# Patient Record
Sex: Female | Born: 1955 | Race: White | Hispanic: No | Marital: Married | Smoking: Never smoker
Health system: Southern US, Community
[De-identification: ages and names within clinical notes are randomized; demographics above are authoritative.]

## PROBLEM LIST (undated history)

## (undated) DIAGNOSIS — L0591 Pilonidal cyst without abscess: Secondary | ICD-10-CM

## (undated) DIAGNOSIS — F32A Depression, unspecified: Secondary | ICD-10-CM

## (undated) DIAGNOSIS — F329 Major depressive disorder, single episode, unspecified: Secondary | ICD-10-CM

## (undated) DIAGNOSIS — F419 Anxiety disorder, unspecified: Secondary | ICD-10-CM

## (undated) HISTORY — PX: WISDOM TOOTH EXTRACTION: SHX21

## (undated) HISTORY — DX: Depression, unspecified: F32.A

## (undated) HISTORY — DX: Major depressive disorder, single episode, unspecified: F32.9

## (undated) HISTORY — DX: Pilonidal cyst without abscess: L05.91

## (undated) HISTORY — DX: Anxiety disorder, unspecified: F41.9

---

## 2000-01-16 ENCOUNTER — Other Ambulatory Visit: Admission: RE | Admit: 2000-01-16 | Discharge: 2000-01-16 | Payer: Self-pay | Admitting: Obstetrics and Gynecology

## 2001-04-12 ENCOUNTER — Other Ambulatory Visit: Admission: RE | Admit: 2001-04-12 | Discharge: 2001-04-12 | Payer: Self-pay | Admitting: Obstetrics and Gynecology

## 2002-12-15 ENCOUNTER — Other Ambulatory Visit: Admission: RE | Admit: 2002-12-15 | Discharge: 2002-12-15 | Payer: Self-pay | Admitting: Obstetrics and Gynecology

## 2004-01-11 ENCOUNTER — Other Ambulatory Visit: Admission: RE | Admit: 2004-01-11 | Discharge: 2004-01-11 | Payer: Self-pay | Admitting: Obstetrics and Gynecology

## 2005-01-20 ENCOUNTER — Other Ambulatory Visit: Admission: RE | Admit: 2005-01-20 | Discharge: 2005-01-20 | Payer: Self-pay | Admitting: Obstetrics and Gynecology

## 2006-02-24 ENCOUNTER — Other Ambulatory Visit: Admission: RE | Admit: 2006-02-24 | Discharge: 2006-02-24 | Payer: Self-pay | Admitting: Obstetrics and Gynecology

## 2007-04-01 ENCOUNTER — Other Ambulatory Visit: Admission: RE | Admit: 2007-04-01 | Discharge: 2007-04-01 | Payer: Self-pay | Admitting: Obstetrics and Gynecology

## 2008-04-09 ENCOUNTER — Other Ambulatory Visit: Admission: RE | Admit: 2008-04-09 | Discharge: 2008-04-09 | Payer: Self-pay | Admitting: Obstetrics and Gynecology

## 2009-02-01 ENCOUNTER — Other Ambulatory Visit: Admission: RE | Admit: 2009-02-01 | Discharge: 2009-02-01 | Payer: Self-pay | Admitting: Obstetrics and Gynecology

## 2012-04-22 ENCOUNTER — Other Ambulatory Visit: Payer: Self-pay | Admitting: Family Medicine

## 2012-04-22 DIAGNOSIS — R599 Enlarged lymph nodes, unspecified: Secondary | ICD-10-CM

## 2012-05-04 ENCOUNTER — Other Ambulatory Visit: Payer: Self-pay | Admitting: Family Medicine

## 2012-05-04 ENCOUNTER — Ambulatory Visit
Admission: RE | Admit: 2012-05-04 | Discharge: 2012-05-04 | Disposition: A | Discharge status: Home / Self Care | Payer: BC Managed Care – PPO | Source: Ambulatory Visit | Attending: Family Medicine | Admitting: Family Medicine

## 2012-05-04 DIAGNOSIS — R599 Enlarged lymph nodes, unspecified: Secondary | ICD-10-CM

## 2012-05-04 DIAGNOSIS — R102 Pelvic and perineal pain: Secondary | ICD-10-CM

## 2013-01-24 ENCOUNTER — Other Ambulatory Visit: Payer: Self-pay | Admitting: Obstetrics and Gynecology

## 2013-01-24 ENCOUNTER — Telehealth: Payer: Self-pay | Admitting: *Deleted

## 2013-01-24 NOTE — Telephone Encounter (Signed)
Please review prior authorization form to be faxed for [facility], for [place]. Fannie Knee , will fax when completed. Chart in your cabinet.

## 2013-02-09 ENCOUNTER — Other Ambulatory Visit: Payer: Self-pay | Admitting: *Deleted

## 2013-02-09 NOTE — Telephone Encounter (Signed)
See chart in cabinet . PRIOR APPROVAL FOR MEDICATION FROM [facility] WAS NOT APPROVED DUE TO QUANITY  LIMITS EXCEPTIONS. EXCEEDS THE QUANITY THAT COVENTRY DERERMINED TO BE MEDICALLY APPROPIATE. GIANVI IS LIMITED TO 1.5 TABLETS PER DAY.

## 2013-02-14 ENCOUNTER — Telehealth: Payer: Self-pay | Admitting: *Deleted

## 2013-02-14 NOTE — Telephone Encounter (Signed)
FYI Dr. Hyacinth Meeker. Rx for Helen Hashimoto was filled on 01/24/2013 at [facility] for 28 day supply only. With refills . Disregard PA. sue

## 2013-02-23 IMAGING — US US TRANSVAGINAL NON-OB
1 series · 13 of 25 positions shown · non-contrast
Comparison: None.

CLINICAL DATA: Pelvic pain.  Post menopausal female.  On hormone
replacement therapy.



[Series 1: us transvaginal non-ob · 0.22mm/px · 13 of 65 slices shown]
[im 1/65]
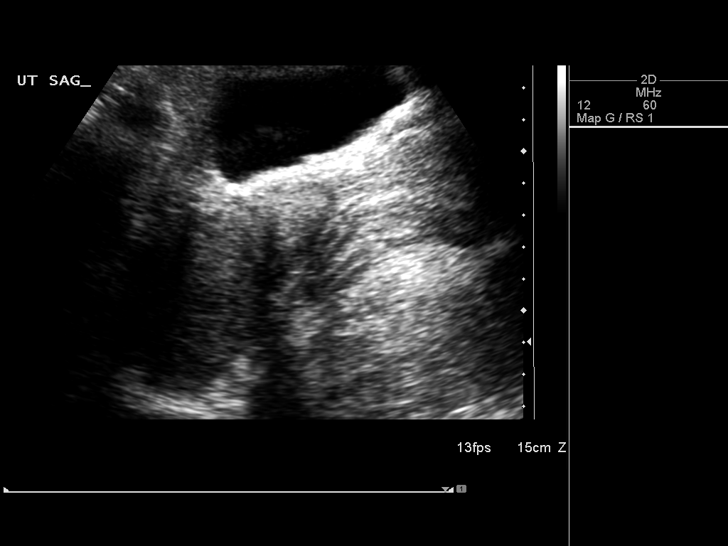
[im 6/65]
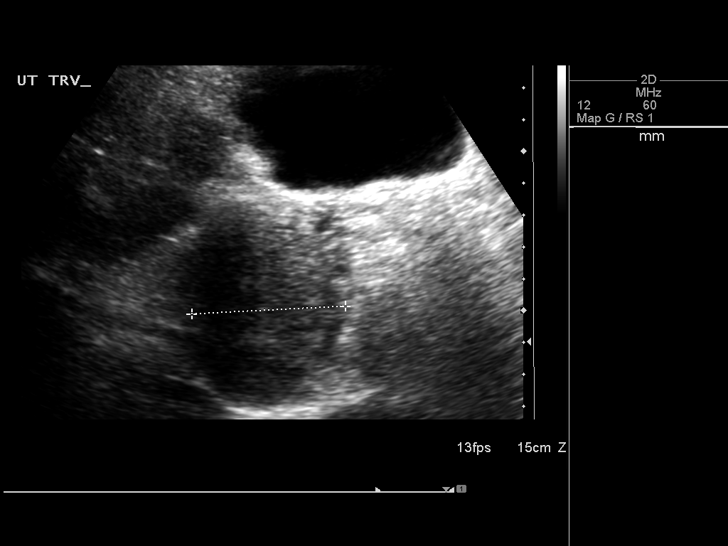
[im 11/65]
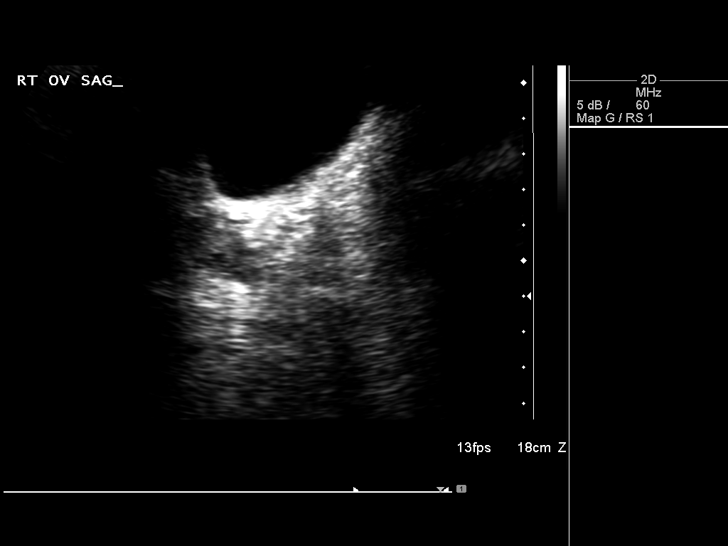
[im 17/65]
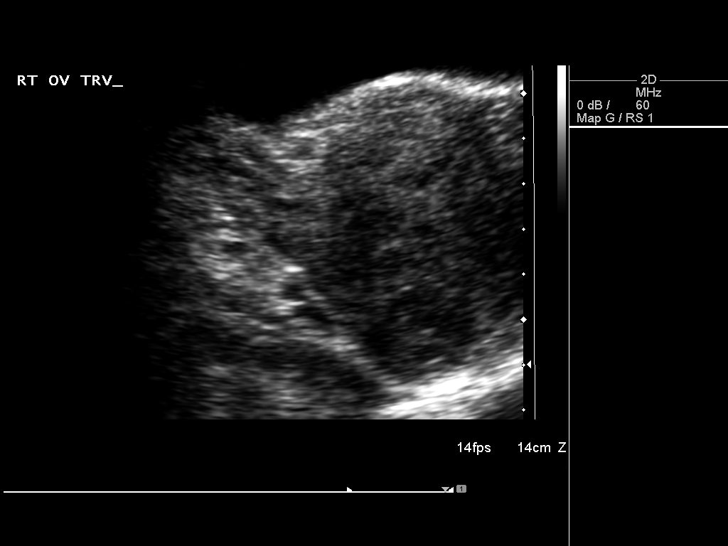
[im 22/65]
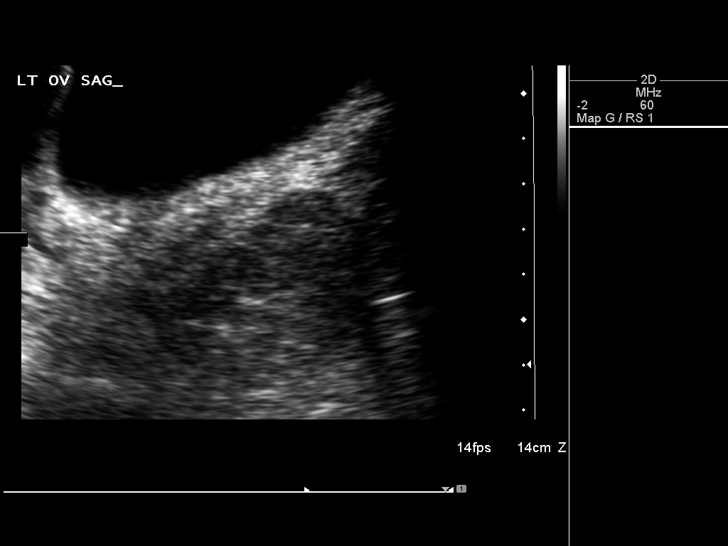
[im 27/65]
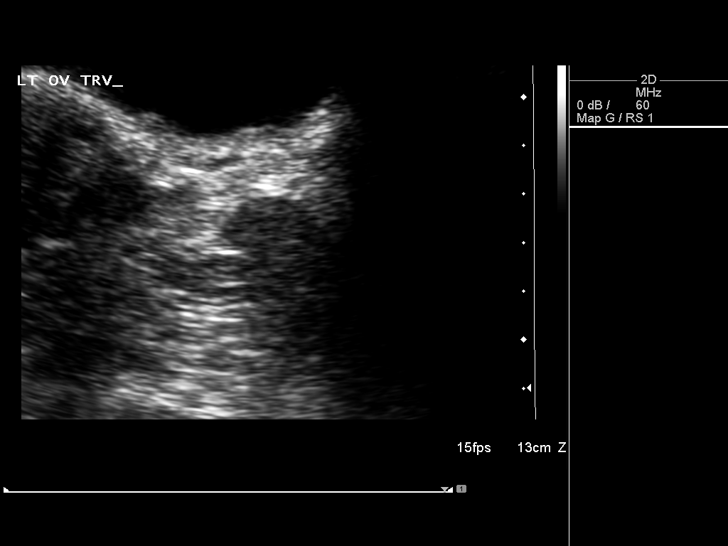
[im 33/65]
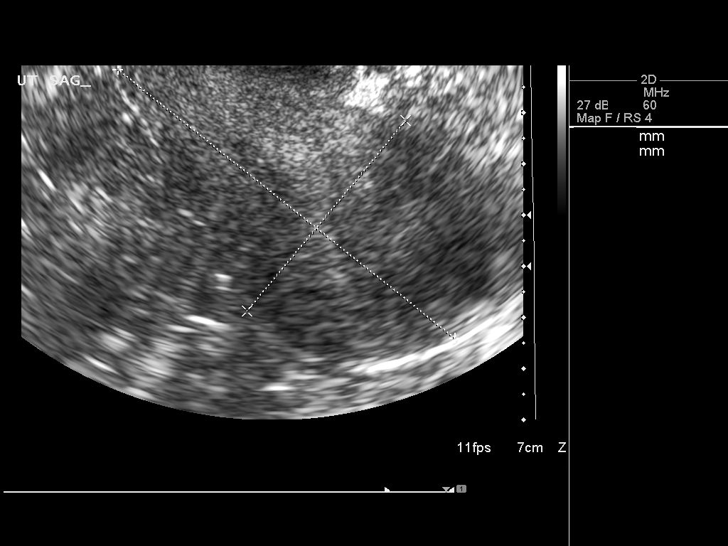
[im 38/65]
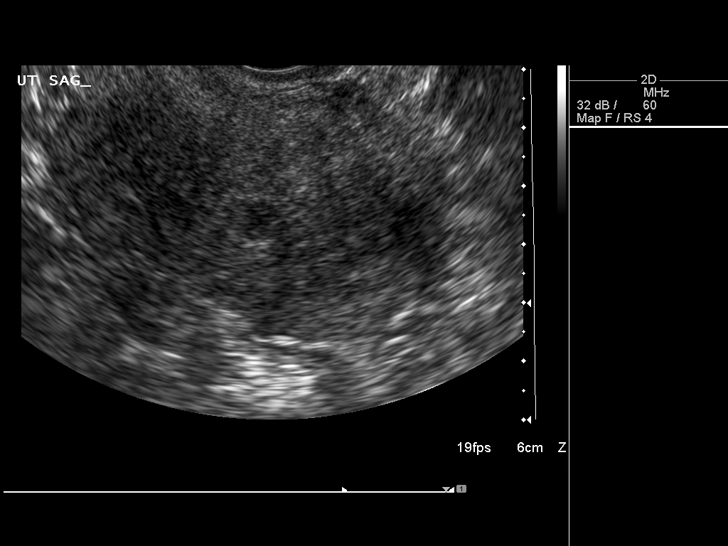
[im 43/65]
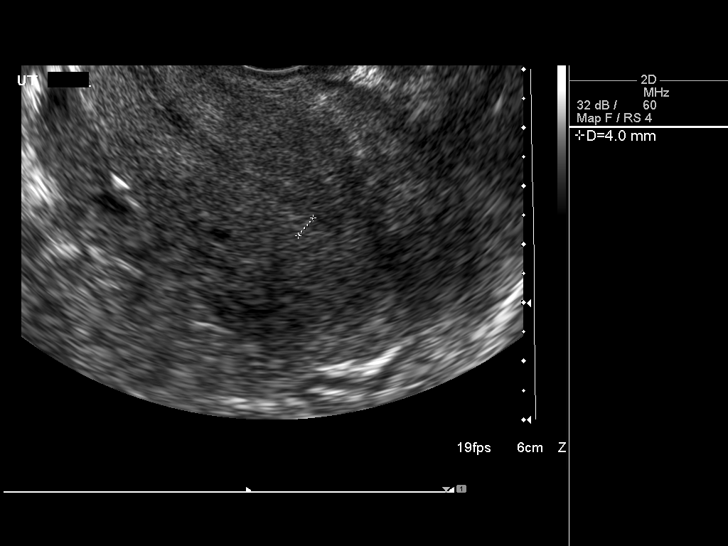
[im 49/65]
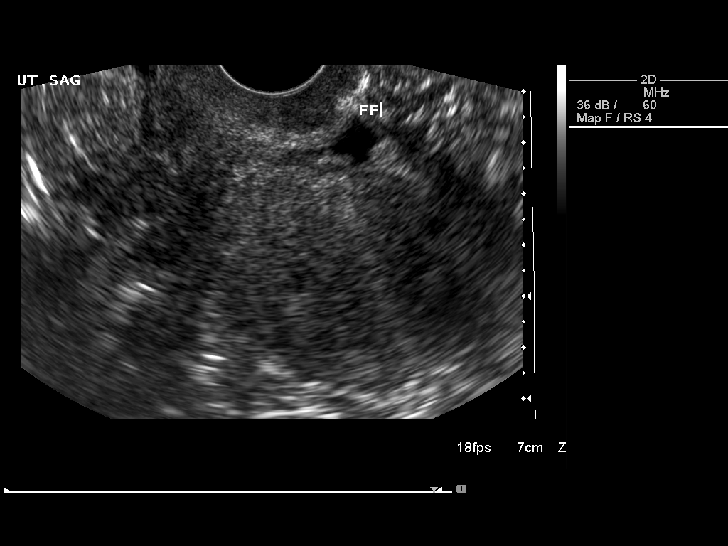
[im 54/65]
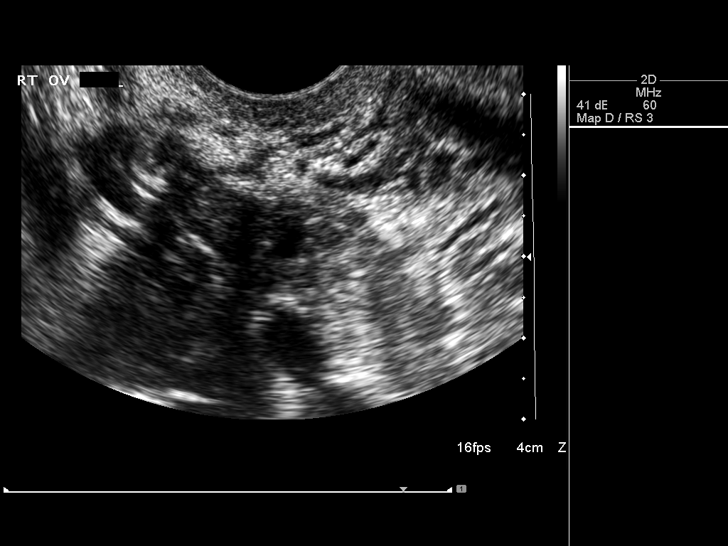
[im 59/65]
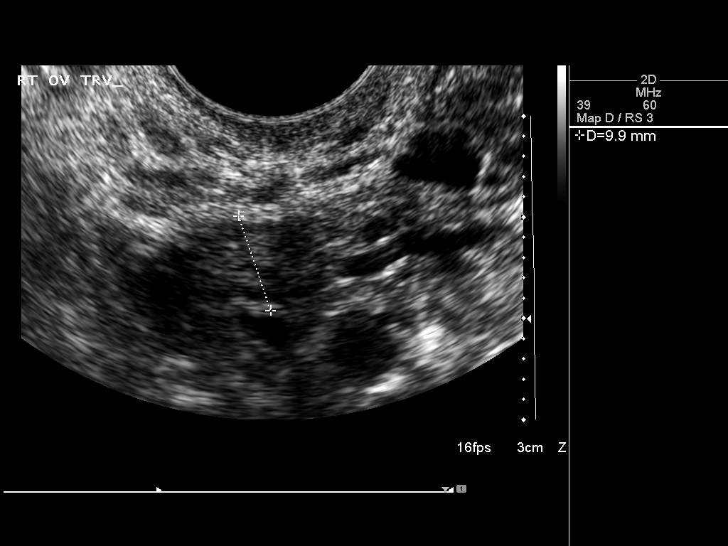
[im 65/65]
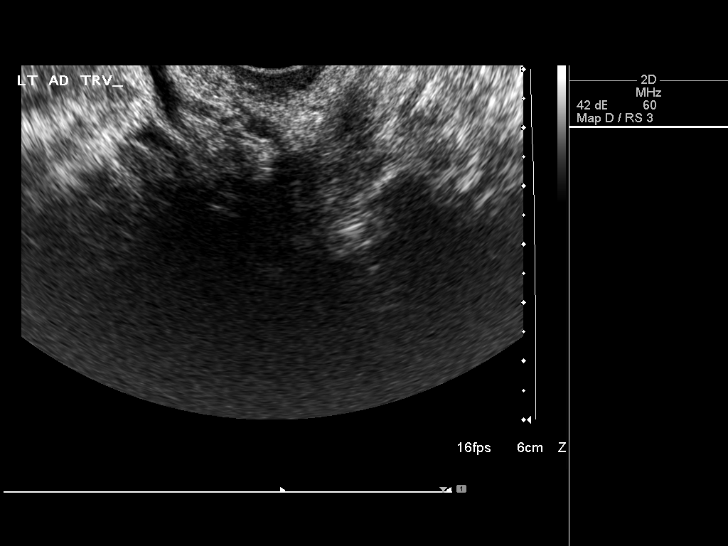

[13 of 25 positions shown; findings below may reference images not displayed]

FINDINGS: Uterus:  8.4 x 4.8 x 5.6 cm.  Retroverted. An intramural fibroid is
seen in the right posterior corpus measuring 2.2 cm in maximum
diameter.  A 1 cm fibroid is seen in the adjacent to the
endometrial stripe in the left fundus.

Endometrium: Double layer thickness measures 10 mm transvaginally.
No focal lesion visualized.

Right ovary: 2.3 x 1.2 x 1.0 cm.  Normal appearance.

Left ovary: 3.1 x 2.1 x 2.2 cm.  Normal appearance.

Other Findings:  No free fluid
IMPRESSION: 1.  Retroverted uterus with two small uterine fibroids, largest
measuring 2.2 cm.
2.  Normal appearance of both ovaries.  No adnexal mass or free
fluid identified.

## 2013-06-21 ENCOUNTER — Ambulatory Visit: Payer: Self-pay | Admitting: Obstetrics and Gynecology

## 2013-07-18 ENCOUNTER — Encounter: Payer: Self-pay | Admitting: Certified Nurse Midwife

## 2013-07-18 ENCOUNTER — Ambulatory Visit (INDEPENDENT_AMBULATORY_CARE_PROVIDER_SITE_OTHER): Payer: No Typology Code available for payment source | Admitting: Certified Nurse Midwife

## 2013-07-18 VITALS — BP 104/64 | HR 64 | Resp 16 | Ht 65.5 in | Wt 130.0 lb

## 2013-07-18 DIAGNOSIS — Z0000 Encounter for general adult medical examination without abnormal findings: Secondary | ICD-10-CM

## 2013-07-18 DIAGNOSIS — Z01419 Encounter for gynecological examination (general) (routine) without abnormal findings: Secondary | ICD-10-CM

## 2013-07-18 DIAGNOSIS — N951 Menopausal and female climacteric states: Secondary | ICD-10-CM

## 2013-07-18 LAB — POCT URINALYSIS DIPSTICK
Blood, UA: NEGATIVE
Protein, UA: NEGATIVE
Urobilinogen, UA: NEGATIVE
pH, UA: 5

## 2013-07-18 NOTE — Patient Instructions (Signed)

## 2013-07-18 NOTE — Progress Notes (Signed)
57 y.o. G54P2002 Divorced Caucasian Fe here for annual exam. Menopausal?. Stopped OCP(YAZ) one month ago, Continued use due to PMS and need for cycle control in past. No bleeding episode since 2008. Current medications for anxiety working well.. Denies hot flashes, vaginal dryness or anxiety issues. May be interested in HRT if becomes symptomatic. Sees PCP for labs and aex and medication management.  Patient's last menstrual period was 10/26/2006.          Sexually active: yes  The current method of family planning is condoms all the time.    Exercising: no  exercise Smoker:  no  Health Maintenance: Pap:  06-10-10 neg MMG:  09/21/12 normal Colonoscopy: 2010 neg. 5 years BMD:   none TDaP:  12/08 Labs:Poct urine-neg, Hgb-13.3 Self breast exam:not done   reports that she has never smoked. She does not have any smokeless tobacco history on file. She reports that she drinks about 2.5 ounces of alcohol per week. She reports that she does not use illicit drugs.  Past Medical History  Diagnosis Date  . Depression   . Pilonidal cyst   . Anxiety     Past Surgical History  Procedure Laterality Date  . Wisdom tooth extraction      Current Outpatient Prescriptions  Medication Sig Dispense Refill  . ALPRAZolam (XANAX) 0.5 MG tablet daily.      Marland Kitchen atorvastatin (LIPITOR) 10 MG tablet daily.      Marland Kitchen buPROPion (WELLBUTRIN XL) 300 MG 24 hr tablet daily.      Marland Kitchen doxycycline (VIBRA-TABS) 100 MG tablet as needed.      . zolpidem (AMBIEN CR) 12.5 MG CR tablet daily.       No current facility-administered medications for this visit.    Family History  Problem Relation Age of Onset  . Scleroderma Mother   . Hypertension Father   . Scleroderma Father   . Hypertension Brother   . Hyperlipidemia Brother     ROS:  Pertinent items are noted in HPI.  Otherwise, a comprehensive ROS was negative.  Exam:   BP 104/64  Pulse 64  Resp 16  Ht 5' 5.5" (1.664 m)  Wt 130 lb (58.968 kg)  BMI 21.3 kg/m2   LMP 10/26/2006 Height: 5' 5.5" (166.4 cm)  Ht Readings from Last 3 Encounters:  07/18/13 5' 5.5" (1.664 m)    General appearance: alert, cooperative and appears stated age Head: Normocephalic, without obvious abnormality, atraumatic Neck: no adenopathy, supple, symmetrical, trachea midline and thyroid normal to inspection and palpation Lungs: clear to auscultation bilaterally Breasts: normal appearance, no masses or tenderness, No nipple retraction or dimpling, No nipple discharge or bleeding, No axillary or supraclavicular adenopathy Heart: regular rate and rhythm Abdomen: soft, non-tender; no masses,  no organomegaly Extremities: extremities normal, atraumatic, no cyanosis or edema Skin: Skin color, texture, turgor normal. No rashes or lesions Lymph nodes: Cervical, supraclavicular, and axillary nodes normal. No abnormal inguinal nodes palpated Neurologic: Grossly normal   Pelvic: External genitalia:  no lesions              Urethra:  normal appearing urethra with no masses, tenderness or lesions              Bartholin's and Skene's: normal                 Vagina: normal appearing vagina with normal color and discharge, no lesions              Cervix: normal, non tender  Pap taken: yes Bimanual Exam:  Uterus:  normal size, contour, position, consistency, mobility, non-tender and anteverted              Adnexa: normal adnexa and no mass, fullness, tenderness               Rectovaginal: Confirms               Anus:  normal sphincter tone, no lesions  A:  Well Woman with normal exam  Menopausal ?, recent OCP  discontinuance one month ago  Anxiety on stable medication with MD  P:   Reviewed health and wellness pertinent to exam  Discussed risks and benefits of HRT. May consider if becomes symptomatic. Lab: [facility]  Pap smear as per guidelines   Mammogram yearly pap smear taken today with HPVHR  counseled on breast self exam, mammography screening, use and side effects of  HRT, adequate intake of calcium and vitamin D, diet and exercise, Kegel's exercises  return annually or prn  An After Visit Summary was printed and given to the patient.

## 2013-07-19 ENCOUNTER — Telehealth: Payer: Self-pay | Admitting: Obstetrics & Gynecology

## 2013-07-19 NOTE — Telephone Encounter (Signed)
Pt calling for Kennon Rounds to leave pharmacy information  [facility] [place] [phone]

## 2013-07-23 NOTE — Progress Notes (Signed)
Note reviewed, agree with plan.  Douglass Rivers, MD [facility] elevated 34

## 2013-07-24 NOTE — Telephone Encounter (Signed)
Please disregard the last entry it was made in error.

## 2013-07-25 ENCOUNTER — Other Ambulatory Visit: Payer: Self-pay | Admitting: Certified Nurse Midwife

## 2013-07-25 DIAGNOSIS — N951 Menopausal and female climacteric states: Secondary | ICD-10-CM

## 2013-07-25 NOTE — Progress Notes (Signed)
Order in.

## 2013-07-28 ENCOUNTER — Other Ambulatory Visit (INDEPENDENT_AMBULATORY_CARE_PROVIDER_SITE_OTHER): Payer: No Typology Code available for payment source

## 2013-07-28 DIAGNOSIS — N951 Menopausal and female climacteric states: Secondary | ICD-10-CM

## 2013-07-31 LAB — ANTI MULLERIAN HORMONE: AMH AssessR: <0.03 ng/mL

## 2013-09-30 ENCOUNTER — Other Ambulatory Visit (HOSPITAL_COMMUNITY): Payer: Self-pay | Admitting: Psychiatry

## 2013-10-17 ENCOUNTER — Telehealth: Payer: Self-pay | Admitting: Certified Nurse Midwife

## 2013-10-17 NOTE — Telephone Encounter (Signed)
Patient c/o vaginal pain, labial irritation and vaginal discharge.  States "this doesn't feel like a UTI". Does not feel this is yeast r/t as well. Recent sexual relations with new partner. Patient concerned may be STD related.  Denies fevers or abdominal pain.  Office visit scheduled.  Patient requests appointment for 12/24. Declines OV today.  Routing to provider for final review. Patient agreeable to disposition. Will close encounter

## 2013-10-17 NOTE — Telephone Encounter (Signed)
Discharge and burning.

## 2013-10-18 ENCOUNTER — Encounter: Payer: Self-pay | Admitting: Obstetrics & Gynecology

## 2013-10-18 ENCOUNTER — Ambulatory Visit (INDEPENDENT_AMBULATORY_CARE_PROVIDER_SITE_OTHER): Payer: No Typology Code available for payment source | Admitting: Obstetrics & Gynecology

## 2013-10-18 VITALS — BP 110/56 | HR 60 | Resp 16 | Ht 63.5 in | Wt 120.0 lb

## 2013-10-18 DIAGNOSIS — Z202 Contact with and (suspected) exposure to infections with a predominantly sexual mode of transmission: Secondary | ICD-10-CM

## 2013-10-18 DIAGNOSIS — N898 Other specified noninflammatory disorders of vagina: Secondary | ICD-10-CM

## 2013-10-18 MED ORDER — ESTROGENS, CONJUGATED 0.625 MG/GM VA CREA: 1.0000 | TOPICAL_CREAM | Freq: Every day | VAGINAL | Status: DC

## 2013-10-18 NOTE — Progress Notes (Signed)
Subjective:     Patient ID: Desiree Gibson, female   DOB: 05-Nov-1955, 57 y.o.   MRN: [id number]  HPI 57 yo G2P2 here with 3 day hx of pinkish/whitish discharge.  Having some external irritation.  Color has changed to a little big yellow.  No recent antibiotic use.  Has been wearing some leggings.  Has a new sex partner for four months.  Requests STD testing just to be sure.   Review of Systems  All other systems reviewed and are negative.       Objective:   Physical Exam  Constitutional: She is oriented to person, place, and time. She appears well-developed and well-nourished.  Genitourinary: Uterus normal. There is no rash or tenderness on the right labia. There is no rash or tenderness on the left labia. Uterus is not tender. Cervix exhibits no motion tenderness, no discharge and no friability. Vaginal discharge found.  Lymphadenopathy:       Right: No inguinal adenopathy present.       Left: No inguinal adenopathy present.  Neurological: She is alert and oriented to person, place, and time.  Skin: Skin is warm and dry.  Psychiatric: She has a normal mood and affect.   Wet smear:  Ph: 5.0, saline: no trich, no clue cells, atrohpic cells, RBCs.  KOH:  Neg whiff, neg yeast.    Assessment:     Vaginal dryness Wet smear with RBCs present     Plan:     Premarin vaginal cream 1/2 - 1 gram vaginally every other night for 2 weeks then decrease to 1/2 gram vaginally twice weekly.  Rx to pharmacy. Gc/Chl pending.

## 2013-10-21 LAB — IPS N GONORRHOEA AND CHLAMYDIA BY PCR

## 2013-12-01 ENCOUNTER — Ambulatory Visit (INDEPENDENT_AMBULATORY_CARE_PROVIDER_SITE_OTHER): Payer: No Typology Code available for payment source | Admitting: Obstetrics & Gynecology

## 2013-12-01 ENCOUNTER — Encounter: Payer: Self-pay | Admitting: Obstetrics & Gynecology

## 2013-12-01 VITALS — BP 121/65 | HR 63 | Resp 16 | Ht 63.5 in | Wt 117.0 lb

## 2013-12-01 DIAGNOSIS — N898 Other specified noninflammatory disorders of vagina: Secondary | ICD-10-CM

## 2013-12-01 DIAGNOSIS — N368 Other specified disorders of urethra: Secondary | ICD-10-CM

## 2013-12-01 DIAGNOSIS — IMO0002 Reserved for concepts with insufficient information to code with codable children: Secondary | ICD-10-CM

## 2013-12-01 DIAGNOSIS — N951 Menopausal and female climacteric states: Secondary | ICD-10-CM

## 2013-12-01 MED ORDER — ESTROGENS, CONJUGATED 0.625 MG/GM VA CREA: 1.0000 | TOPICAL_CREAM | Freq: Every day | VAGINAL | Status: DC

## 2013-12-01 MED ORDER — ESTROGENS, CONJUGATED 0.625 MG/GM VA CREA: TOPICAL_CREAM | VAGINAL | Status: DC

## 2013-12-01 NOTE — Progress Notes (Signed)
Subjective:     Patient ID: Desiree Gibson, female   DOB: 02/09/1956, 58 y.o.   MRN: [id number]  HPI 58 yo G2P2 here for follow up after starting premarin cream.  Has been [facility] for [facility].  States discharge is much better.  She is using the premain cream twice weekly.  Not sexually active much because of the UTIs but is ready to try and see if this is helping.  Wants to make sure her UTI was completely treated.  Has not going back for TOC's.  Still feels like she has some urgency.  Review of Systems  All other systems reviewed and are negative.       Objective:   Physical Exam  Constitutional: She is oriented to person, place, and time. She appears well-developed and well-nourished.  Genitourinary: Uterus normal. There is no rash or tenderness on the right labia. There is no rash or tenderness on the left labia. Uterus is not tender. Cervix exhibits no motion tenderness, no discharge and no friability.  No vaginal discharge noted today. Lymphadenopathy:       Right: No inguinal adenopathy present.       Left: No inguinal adenopathy present.  Neurological: She is alert and oriented to person, place, and time.  Skin: Skin is warm and dry.  Psychiatric: She has a normal mood and affect.      Assessment:     Atrophic vaginal changes, improved with Premarin cream H/O 2 UTIs recently with urethral irritation    Plan:     Urine culture Continue Premarin vaginal cream 1/2 gram vaginally twice weekly . May decrease to weekly if desired.  Rx with RFs provided.

## 2013-12-03 LAB — URINE CULTURE
Colony Count: NO GROWTH
ORGANISM ID, BACTERIA: NO GROWTH

## 2013-12-07 ENCOUNTER — Telehealth: Payer: Self-pay

## 2013-12-07 NOTE — Telephone Encounter (Signed)
Lmtcb//kn 

## 2013-12-07 NOTE — Telephone Encounter (Signed)
Message copied by Elisha HeadlandNIX, Dajanee Voorheis S on Thu Dec 07, 2013  3:04 PM ------      Message from: Jerene BearsMILLER, MARY S      Created: Thu Dec 07, 2013  5:38 AM       Inform urine culture negative.  Pt has two prior UTIs without test of cure.  Please let her know I'd like to know if she has another UTI or symptoms. ------

## 2013-12-15 NOTE — Telephone Encounter (Signed)
Patient is returning a call to Kelly °

## 2013-12-18 NOTE — Telephone Encounter (Signed)
Patient returning Kelly's call. °

## 2013-12-26 NOTE — Telephone Encounter (Signed)
Patient notified

## 2013-12-26 NOTE — Telephone Encounter (Signed)
Patient aware of all results. 

## 2014-03-10 ENCOUNTER — Other Ambulatory Visit: Payer: Self-pay | Admitting: Obstetrics & Gynecology

## 2014-03-12 NOTE — Telephone Encounter (Signed)
Last AEX 07/18/13 Last refill 12/01/13 #30g/ 3 refills Next appt 07/27/14 Last MMG 10/06/13 BI-RADS 1: Neg.  Rx sent until next appt.

## 2014-07-27 ENCOUNTER — Telehealth: Payer: Self-pay | Admitting: Certified Nurse Midwife

## 2014-07-27 ENCOUNTER — Ambulatory Visit: Payer: No Typology Code available for payment source | Admitting: Certified Nurse Midwife

## 2014-07-27 NOTE — Telephone Encounter (Signed)
Pt cancelled appt today because she has bcbs of [place]  that we do not participate with.

## 2014-08-27 ENCOUNTER — Encounter: Payer: Self-pay | Admitting: Obstetrics & Gynecology

## 2014-10-26 NOTE — Telephone Encounter (Signed)
Cleaning out MD in basket. Refill had been denied 09/2013 and pharmacy had been notified.

## 2014-12-04 ENCOUNTER — Telehealth: Payer: Self-pay | Admitting: Obstetrics & Gynecology

## 2014-12-04 NOTE — Telephone Encounter (Signed)
Spoke with patient. Patient states that she is not currently on any form of hormone replacement but would like to discuss this. Patient has been experiencing increased hot flashes and night sweats. "I am unable to get a good nights sleep because I keep waking up sweating." Patient is requesting appointment with Dr.Miller to discuss. Appointment scheduled for 2/15 at 1:45pm. Agreeable to date and time.  Routing to provider for final review. Patient agreeable to disposition. Will close encounter

## 2014-12-04 NOTE — Telephone Encounter (Signed)
Pt would like to speak to a nurse regarding menopause and hormone therapy. Would like to schedule with dr Hyacinth Meekermiller.

## 2014-12-10 ENCOUNTER — Telehealth: Payer: Self-pay | Admitting: Obstetrics & Gynecology

## 2014-12-10 ENCOUNTER — Ambulatory Visit: Payer: No Typology Code available for payment source | Admitting: Obstetrics & Gynecology

## 2014-12-10 NOTE — Telephone Encounter (Signed)
Patient needs to reschedule her appointment for "increased hot flashed/decreased sleep" that was cancelled today due to inclement weather.

## 2014-12-11 NOTE — Telephone Encounter (Signed)
Spoke with patient. Appointment rescheduled to 3/7 at 3pm with Dr.Miller. Patient is agreeable to date and time.  Routing to provider for final review. Patient agreeable to disposition. Will close encounter

## 2014-12-27 ENCOUNTER — Telehealth: Payer: Self-pay | Admitting: Obstetrics & Gynecology

## 2014-12-27 NOTE — Telephone Encounter (Signed)
Patient canceled appointment for increased hot flashes, decreased sleep. She states she will call back at a later time to reschedule this appointment.

## 2014-12-31 ENCOUNTER — Ambulatory Visit: Payer: No Typology Code available for payment source | Admitting: Obstetrics & Gynecology

## 2015-09-16 ENCOUNTER — Other Ambulatory Visit: Payer: Self-pay | Admitting: Family Medicine

## 2015-09-16 ENCOUNTER — Other Ambulatory Visit (HOSPITAL_COMMUNITY)
Admission: RE | Admit: 2015-09-16 | Discharge: 2015-09-16 | Disposition: A | Discharge status: Home / Self Care | Payer: BLUE CROSS/BLUE SHIELD | Source: Ambulatory Visit | Attending: Family Medicine | Admitting: Family Medicine

## 2015-09-16 DIAGNOSIS — Z1151 Encounter for screening for human papillomavirus (HPV): Secondary | ICD-10-CM | POA: Insufficient documentation

## 2015-09-16 DIAGNOSIS — Z124 Encounter for screening for malignant neoplasm of cervix: Secondary | ICD-10-CM | POA: Insufficient documentation

## 2015-09-18 LAB — CYTOLOGY - PAP

## 2016-10-12 ENCOUNTER — Other Ambulatory Visit: Payer: Self-pay | Admitting: Family Medicine

## 2016-10-12 ENCOUNTER — Other Ambulatory Visit (HOSPITAL_COMMUNITY)
Admission: RE | Admit: 2016-10-12 | Discharge: 2016-10-12 | Disposition: A | Discharge status: Home / Self Care | Payer: BLUE CROSS/BLUE SHIELD | Source: Ambulatory Visit | Attending: Family Medicine | Admitting: Family Medicine

## 2016-10-12 DIAGNOSIS — Z1151 Encounter for screening for human papillomavirus (HPV): Secondary | ICD-10-CM | POA: Diagnosis present

## 2016-10-12 DIAGNOSIS — Z124 Encounter for screening for malignant neoplasm of cervix: Secondary | ICD-10-CM | POA: Diagnosis present

## 2016-10-15 LAB — CYTOLOGY - PAP
Diagnosis: NEGATIVE
HPV: NOT DETECTED

## 2017-11-04 ENCOUNTER — Other Ambulatory Visit: Payer: Self-pay | Admitting: Family Medicine

## 2017-11-05 ENCOUNTER — Other Ambulatory Visit: Payer: Self-pay | Admitting: Family Medicine

## 2017-11-05 DIAGNOSIS — M25552 Pain in left hip: Secondary | ICD-10-CM

## 2017-11-22 ENCOUNTER — Ambulatory Visit (INDEPENDENT_AMBULATORY_CARE_PROVIDER_SITE_OTHER): Payer: Managed Care, Other (non HMO) | Admitting: Orthopaedic Surgery

## 2017-11-22 ENCOUNTER — Encounter (INDEPENDENT_AMBULATORY_CARE_PROVIDER_SITE_OTHER): Payer: Self-pay | Admitting: Orthopaedic Surgery

## 2017-11-22 ENCOUNTER — Ambulatory Visit (INDEPENDENT_AMBULATORY_CARE_PROVIDER_SITE_OTHER): Payer: Managed Care, Other (non HMO)

## 2017-11-22 DIAGNOSIS — M25552 Pain in left hip: Secondary | ICD-10-CM

## 2017-11-22 DIAGNOSIS — M1612 Unilateral primary osteoarthritis, left hip: Secondary | ICD-10-CM | POA: Insufficient documentation

## 2017-11-22 NOTE — Progress Notes (Signed)
Office Visit Note   Patient: Desiree Gibson           Date of Birth: 05/04/1956           MRN: [id number] Visit Date: 11/22/2017              Requested by: Lupita RaiderShaw, Kimberlee, MD [address] [place], [place] [id number] PCP: Lupita RaiderShaw, Kimberlee, MD   Assessment & Plan: Visit Diagnoses:  1. Pain in left hip   2. Unilateral primary osteoarthritis, left hip     Plan: We went over her x-rays in detail with her and talked about the disease process of the severe arthritis she has in her left hip.  At this point it is detrimentally affecting her activities of daily living, her quality of life, and her mobility.  Her pain is 10 out of 10.  This is been going on for 2 years now.  She wants to absorb all this information talked her family about this as well.  I gave her our surgery schedulers card we are happy to get this scheduled when she feels it is appropriate.  All questions and concerns were answered and addressed.  We went over the risks and benefits of surgery in detail and I showed  her a pamphlet displaying and explained the surgery in general.  We went over a hip model as well.  Follow-Up Instructions: Return if symptoms worsen or fail to improve.   Orders:  Orders Placed This Encounter  Procedures  . XR HIP UNILAT W OR W/O PELVIS 1V LEFT   No orders of the defined types were placed in this encounter.     Procedures: No procedures performed   Clinical Data: No additional findings.   Subjective: Chief Complaint  Patient presents with  . Left Hip - Pain  The patient is a very active 62 year old female who comes in with worsening hip pain for about 2 years now.  She reports pain in her left groin as well as pain that wakes her up at night and with rotation of the hip.  The hip is getting getting stiffer.  He is having trouble getting her shoes on and off and get her socks on all.  She has problems crossing her legs.  She has not had any x-rays.  Her pain can be daily and is  10 out of 10 at times.  Is now detrimentally affecting her active daily living, quality of life, mobility.  She currently denies any headache, chest pain, shortness of breath, fever, chills, nausea, vomiting.  HPI  Review of Systems She currently denies any headache, chest pain, shortness of breath, fever, chills, nausea, vomiting.  Objective: Vital Signs: LMP 10/26/2006   Physical Exam She is alert and oriented x3 and in no acute distress Ortho Exam Examination of her right hip is normal examination of her left hip shows severe pain with any attempts of internal or external rotation of that hip.  Her leg lengths are equal. Specialty Comments:  No specialty comments available.  Imaging: Xr Hip Unilat W Or W/o Pelvis 1v Left  Result Date: 11/22/2017 An AP pelvis and a lateral left hip show severe end-stage arthritis.  There is significant para-articular osteophytes as well as cystic changes in the acetabulum and the femoral head.  There is severe joint space narrowing as well.    PMFS History: Patient Active Problem List   Diagnosis Date Noted  . Unilateral primary osteoarthritis, left hip 11/22/2017   Past Medical  History:  Diagnosis Date  . Anxiety   . Depression   . Pilonidal cyst     Family History  Problem Relation Age of Onset  . Scleroderma Mother   . Hypertension Father   . Scleroderma Father   . Hypertension Brother   . Hyperlipidemia Brother     Past Surgical History:  Procedure Laterality Date  . WISDOM TOOTH EXTRACTION     Social History   Occupational History  . Not on file  Tobacco Use  . Smoking status: Never Smoker  . Smokeless tobacco: Never Used  Substance and Sexual Activity  . Alcohol use: Yes    Alcohol/week: 2.5 oz    Types: 5 drink(s) per week  . Drug use: No  . Sexual activity: Yes    Partners: Male    Birth control/protection: Condom, Post-menopausal

## 2017-11-26 ENCOUNTER — Telehealth (INDEPENDENT_AMBULATORY_CARE_PROVIDER_SITE_OTHER): Payer: Self-pay | Admitting: Orthopaedic Surgery

## 2017-11-26 NOTE — Telephone Encounter (Signed)
Patient requesting copy of X-ray from 11/22/17. Please advise # [phone]

## 2017-11-29 ENCOUNTER — Telehealth (INDEPENDENT_AMBULATORY_CARE_PROVIDER_SITE_OTHER): Payer: Self-pay | Admitting: Orthopaedic Surgery

## 2017-11-29 NOTE — Telephone Encounter (Signed)
DONE. LMOM CD is ready for pickup

## 2017-11-29 NOTE — Telephone Encounter (Signed)
Patient called to get her surgery scheduled with Dr. Magnus IvanBlackman

## 2017-12-03 NOTE — Telephone Encounter (Signed)
I called patient and left voice mail for return call to schedule. 

## 2017-12-13 ENCOUNTER — Other Ambulatory Visit: Payer: BLUE CROSS/BLUE SHIELD

## 2018-01-04 ENCOUNTER — Other Ambulatory Visit (INDEPENDENT_AMBULATORY_CARE_PROVIDER_SITE_OTHER): Payer: Self-pay

## 2018-01-07 ENCOUNTER — Encounter (HOSPITAL_COMMUNITY): Payer: Self-pay | Admitting: *Deleted

## 2018-01-07 NOTE — Patient Instructions (Addendum)
Desiree Gibson  01/07/2018   Your procedure is scheduled on: 01-21-18   Report to [facility] Main  Entrance Report to Admitting at 10:45 AM    Call this number if you have problems the morning of surgery [phone]   Remember: Do not eat food or drink liquids :After Midnight. You may have a Clear Liquid Diet from Midnight until 7:15 AM. After 7:15 AM, nothing until after surgery.     CLEAR LIQUID DIET   Foods Allowed                                                                     Foods Excluded  Coffee and tea, regular and decaf                             liquids that you cannot  Plain Jell-O in any flavor                                             see through such as: Fruit ices (not with fruit pulp)                                     milk, soups, orange juice  Iced Popsicles                                    All solid food Carbonated beverages, regular and diet                                    Cranberry, grape and apple juices Sports drinks like Gatorade Lightly seasoned clear broth or consume(fat free) Sugar, honey syrup  Sample Menu Breakfast                                Lunch                                     Supper Cranberry juice                    Beef broth                            Chicken broth Jell-O                                     Grape juice                           Apple juice  Coffee or tea                        Jell-O                                      Popsicle                                                Coffee or tea                        Coffee or tea  _____________________________________________________________________     Take these medicines the morning of surgery with A SIP OF WATER: Alprazolam (Xanax), Bupropion (Wellbutrin-XL) and Atorvastatin (Lipitor)                                You may not have any metal on your body including hair pins and              piercings  Do not wear jewelry, make-up,  lotions, powders or perfumes, deodorant             Do not wear nail polish.  Do not shave  48 hours prior to surgery.                 Do not bring valuables to the hospital. [facility] IS NOT             RESPONSIBLE   FOR VALUABLES.  Contacts, dentures or bridgework may not be worn into surgery.  Leave suitcase in the car. After surgery it may be brought to your room.                Please read over the following fact sheets you were given: _____________________________________________________________________             [facility] - Preparing for Surgery Before surgery, you can play an important role.  Because skin is not sterile, your skin needs to be as free of germs as possible.  You can reduce the number of germs on your skin by washing with CHG (chlorahexidine gluconate) soap before surgery.  CHG is an antiseptic cleaner which kills germs and bonds with the skin to continue killing germs even after washing. Please DO NOT use if you have an allergy to CHG or antibacterial soaps.  If your skin becomes reddened/irritated stop using the CHG and inform your nurse when you arrive at Short Stay. Do not shave (including legs and underarms) for at least 48 hours prior to the first CHG shower.  You may shave your face/neck. Please follow these instructions carefully:  1.  Shower with CHG Soap the night before surgery and the  morning of Surgery.  2.  If you choose to wash your hair, wash your hair first as usual with your  normal  shampoo.  3.  After you shampoo, rinse your hair and body thoroughly to remove the  shampoo.                           4.  Use CHG as you would any other liquid soap.  You can apply chg directly  to the skin and wash                       Gently with a scrungie or clean washcloth.  5.  Apply the CHG Soap to your body ONLY FROM THE NECK DOWN.   Do not use on face/ open                           Wound or open sores. Avoid contact with eyes, ears mouth and genitals  (private parts).                       Wash face,  Genitals (private parts) with your normal soap.             6.  Wash thoroughly, paying special attention to the area where your surgery  will be performed.  7.  Thoroughly rinse your body with warm water from the neck down.  8.  DO NOT shower/wash with your normal soap after using and rinsing off  the CHG Soap.                9.  Pat yourself dry with a clean towel.            10.  Wear clean pajamas.            11.  Place clean sheets on your bed the night of your first shower and do not  sleep with pets. Day of Surgery : Do not apply any lotions/deodorants the morning of surgery.  Please wear clean clothes to the hospital/surgery center.  FAILURE TO FOLLOW THESE INSTRUCTIONS MAY RESULT IN THE CANCELLATION OF YOUR SURGERY PATIENT SIGNATURE_________________________________  NURSE SIGNATURE__________________________________  ________________________________________________________________________   Desiree Gibson  An incentive spirometer is a tool that can help keep your lungs clear and active. This tool measures how well you are filling your lungs with each breath. Taking long deep breaths may help reverse or decrease the chance of developing breathing (pulmonary) problems (especially infection) following:  A long period of time when you are unable to move or be active. BEFORE THE PROCEDURE   If the spirometer includes an indicator to show your best effort, your nurse or respiratory therapist will set it to a desired goal.  If possible, sit up straight or lean slightly forward. Try not to slouch.  Hold the incentive spirometer in an upright position. INSTRUCTIONS FOR USE  1. Sit on the edge of your bed if possible, or sit up as far as you can in bed or on a chair. 2. Hold the incentive spirometer in an upright position. 3. Breathe out normally. 4. Place the mouthpiece in your mouth and seal your lips tightly around  it. 5. Breathe in slowly and as deeply as possible, raising the piston or the ball toward the top of the column. 6. Hold your breath for 3-5 seconds or for as long as possible. Allow the piston or ball to fall to the bottom of the column. 7. Remove the mouthpiece from your mouth and breathe out normally. 8. Rest for a few seconds and repeat Steps 1 through 7 at least 10 times every 1-2 hours when you are awake. Take your time and take a few normal breaths between deep breaths. 9. The spirometer may include an indicator to show your best effort. Use the indicator as a goal to work  toward during each repetition. 10. After each set of 10 deep breaths, practice coughing to be sure your lungs are clear. If you have an incision (the cut made at the time of surgery), support your incision when coughing by placing a pillow or rolled up towels firmly against it. Once you are able to get out of bed, walk around indoors and cough well. You may stop using the incentive spirometer when instructed by your caregiver.  RISKS AND COMPLICATIONS  Take your time so you do not get dizzy or light-headed.  If you are in pain, you may need to take or ask for pain medication before doing incentive spirometry. It is harder to take a deep breath if you are having pain. AFTER USE  Rest and breathe slowly and easily.  It can be helpful to keep track of a log of your progress. Your caregiver can provide you with a simple table to help with this. If you are using the spirometer at home, follow these instructions: SEEK MEDICAL CARE IF:   You are having difficultly using the spirometer.  You have trouble using the spirometer as often as instructed.  Your pain medication is not giving enough relief while using the spirometer.  You develop fever of 100.5 F (38.1 C) or higher. SEEK IMMEDIATE MEDICAL CARE IF:   You cough up bloody sputum that had not been present before.  You develop fever of 102 F (38.9 C) or  greater.  You develop worsening pain at or near the incision site. MAKE SURE YOU:   Understand these instructions.  Will watch your condition.  Will get help right away if you are not doing well or get worse. Document Released: 02/22/2007 Document Revised: 01/04/2012 Document Reviewed: 04/25/2007 [facility] Patient Information 2014 [place], [place].   ________________________________________________________________________

## 2018-01-10 NOTE — Progress Notes (Signed)
Surgery on 3/29.  preop on 3/21.  Need orders in epic.

## 2018-01-12 ENCOUNTER — Other Ambulatory Visit (INDEPENDENT_AMBULATORY_CARE_PROVIDER_SITE_OTHER): Payer: Self-pay | Admitting: Physician Assistant

## 2018-01-13 ENCOUNTER — Telehealth (INDEPENDENT_AMBULATORY_CARE_PROVIDER_SITE_OTHER): Payer: Self-pay | Admitting: Orthopaedic Surgery

## 2018-01-13 ENCOUNTER — Other Ambulatory Visit: Payer: Self-pay

## 2018-01-13 ENCOUNTER — Encounter (HOSPITAL_COMMUNITY): Payer: Self-pay

## 2018-01-13 ENCOUNTER — Encounter (HOSPITAL_COMMUNITY)
Admission: RE | Admit: 2018-01-13 | Discharge: 2018-01-13 | Disposition: A | Discharge status: Home / Self Care | Payer: Managed Care, Other (non HMO) | Source: Ambulatory Visit | Attending: Orthopaedic Surgery | Admitting: Orthopaedic Surgery

## 2018-01-13 DIAGNOSIS — Z01812 Encounter for preprocedural laboratory examination: Secondary | ICD-10-CM | POA: Diagnosis present

## 2018-01-13 DIAGNOSIS — M1611 Unilateral primary osteoarthritis, right hip: Secondary | ICD-10-CM | POA: Diagnosis not present

## 2018-01-13 LAB — CBC
HEMATOCRIT: 40.4 % (ref 36.0–46.0)
HEMOGLOBIN: 13.6 g/dL (ref 12.0–15.0)
MCH: 31.1 pg (ref 26.0–34.0)
MCHC: 33.7 g/dL (ref 30.0–36.0)
MCV: 92.4 fL (ref 78.0–100.0)
Platelets: 233 10*3/uL (ref 150–400)
RBC: 4.37 MIL/uL (ref 3.87–5.11)
RDW: 12 % (ref 11.5–15.5)
WBC: 6.3 10*3/uL (ref 4.0–10.5)

## 2018-01-13 LAB — BASIC METABOLIC PANEL
ANION GAP: 10 (ref 5–15)
BUN: 11 mg/dL (ref 6–20)
CO2: 27 mmol/L (ref 22–32)
Calcium: 9.6 mg/dL (ref 8.9–10.3)
Chloride: 105 mmol/L (ref 101–111)
Creatinine, Ser: 0.79 mg/dL (ref 0.44–1.00)
GFR calc non Af Amer: >60 mL/min (ref >60)
Glucose, Bld: 92 mg/dL (ref 65–99)
Potassium: 4.2 mmol/L (ref 3.5–5.1)
SODIUM: 142 mmol/L (ref 135–145)

## 2018-01-13 LAB — SURGICAL PCR SCREEN
MRSA, PCR: NEGATIVE
Staphylococcus aureus: NEGATIVE

## 2018-01-13 NOTE — Telephone Encounter (Signed)
Patient called wanting to confirm that Dr. Magnus IvanBlackman does not want her to take her Meloxicam starting today.  CB# [phone].  Thank you.

## 2018-01-13 NOTE — Telephone Encounter (Signed)
Patient aware she stops this 5 days before surgery

## 2018-01-14 LAB — ABO/RH: ABO/RH(D): O POS

## 2018-01-20 MED ORDER — TRANEXAMIC ACID 1000 MG/10ML IV SOLN
1000.0000 mg | INTRAVENOUS | Status: AC
  Administered 2018-01-21: 1000 mg via INTRAVENOUS
  Filled 2018-01-20: qty 1100

## 2018-01-20 NOTE — Anesthesia Preprocedure Evaluation (Addendum)
Anesthesia Evaluation  Patient identified by MRN, date of birth, ID band Patient awake    Reviewed: Allergy & Precautions, NPO status , Patient's Chart, lab work & pertinent test results  Airway Mallampati: II  TM Distance: >3 FB Neck ROM: Full    Dental no notable dental hx.    Pulmonary neg pulmonary ROS,    Pulmonary exam normal breath sounds clear to auscultation       Cardiovascular negative cardio ROS Normal cardiovascular exam Rhythm:Regular Rate:Normal     Neuro/Psych negative neurological ROS     GI/Hepatic negative GI ROS, Neg liver ROS,   Endo/Other  negative endocrine ROS  Renal/GU negative Renal ROS     Musculoskeletal  (+) Arthritis ,   Abdominal   Peds  Hematology   Anesthesia Other Findings   Reproductive/Obstetrics                            Lab Results  Component Value Date   WBC 6.3 01/13/2018   HGB 13.6 01/13/2018   HCT 40.4 01/13/2018   MCV 92.4 01/13/2018   PLT 233 01/13/2018   Lab Results  Component Value Date   CREATININE 0.79 01/13/2018   BUN 11 01/13/2018   NA 142 01/13/2018   K 4.2 01/13/2018   CL 105 01/13/2018   CO2 27 01/13/2018     Anesthesia Physical Anesthesia Plan  ASA: II  Anesthesia Plan: Spinal   Post-op Pain Management:    Induction:   PONV Risk Score and Plan:   Airway Management Planned: Mask, Natural Airway and Nasal Cannula  Additional Equipment:   Intra-op Plan:   Post-operative Plan:   Informed Consent: I have reviewed the patients History and Physical, chart, labs and discussed the procedure including the risks, benefits and alternatives for the proposed anesthesia with the patient or authorized representative who has indicated his/her understanding and acceptance.     Plan Discussed with:   Anesthesia Plan Comments:        Anesthesia Quick Evaluation

## 2018-01-21 ENCOUNTER — Inpatient Hospital Stay (HOSPITAL_COMMUNITY): Payer: Managed Care, Other (non HMO)

## 2018-01-21 ENCOUNTER — Encounter (HOSPITAL_COMMUNITY)
Admission: RE | Disposition: A | Discharge status: Home Health | Payer: Self-pay | Source: Ambulatory Visit | Attending: Orthopaedic Surgery

## 2018-01-21 ENCOUNTER — Inpatient Hospital Stay (HOSPITAL_COMMUNITY): Payer: Managed Care, Other (non HMO) | Admitting: Anesthesiology

## 2018-01-21 ENCOUNTER — Encounter (HOSPITAL_COMMUNITY): Payer: Self-pay

## 2018-01-21 ENCOUNTER — Inpatient Hospital Stay (HOSPITAL_COMMUNITY)
Admission: RE | Admit: 2018-01-21 | Discharge: 2018-01-22 | DRG: 470 | Disposition: A | Discharge status: Home Health | Payer: Managed Care, Other (non HMO) | Source: Ambulatory Visit | Attending: Orthopaedic Surgery | Admitting: Orthopaedic Surgery

## 2018-01-21 DIAGNOSIS — Z96642 Presence of left artificial hip joint: Secondary | ICD-10-CM

## 2018-01-21 DIAGNOSIS — M25552 Pain in left hip: Secondary | ICD-10-CM | POA: Diagnosis present

## 2018-01-21 DIAGNOSIS — Z79899 Other long term (current) drug therapy: Secondary | ICD-10-CM | POA: Diagnosis not present

## 2018-01-21 DIAGNOSIS — M1612 Unilateral primary osteoarthritis, left hip: Principal | ICD-10-CM | POA: Diagnosis present

## 2018-01-21 DIAGNOSIS — F419 Anxiety disorder, unspecified: Secondary | ICD-10-CM | POA: Diagnosis present

## 2018-01-21 DIAGNOSIS — F329 Major depressive disorder, single episode, unspecified: Secondary | ICD-10-CM | POA: Diagnosis present

## 2018-01-21 DIAGNOSIS — Z419 Encounter for procedure for purposes other than remedying health state, unspecified: Secondary | ICD-10-CM

## 2018-01-21 HISTORY — PX: TOTAL HIP ARTHROPLASTY: SHX124

## 2018-01-21 LAB — TYPE AND SCREEN
ABO/RH(D): O POS
ANTIBODY SCREEN: NEGATIVE

## 2018-01-21 SURGERY — ARTHROPLASTY, HIP, TOTAL, ANTERIOR APPROACH
Anesthesia: Spinal | Site: Hip | Laterality: Left

## 2018-01-21 MED ORDER — SODIUM CHLORIDE 0.9 % IV SOLN
INTRAVENOUS | Status: DC
  Administered 2018-01-21 – 2018-01-22 (×2): via INTRAVENOUS

## 2018-01-21 MED ORDER — METOCLOPRAMIDE HCL 5 MG/ML IJ SOLN: 5.0000 mg | Freq: Three times a day (TID) | INTRAMUSCULAR | Status: DC | PRN

## 2018-01-21 MED ORDER — ZOLPIDEM TARTRATE 5 MG PO TABS
5.0000 mg | ORAL_TABLET | Freq: Every evening | ORAL | Status: DC | PRN
  Administered 2018-01-21: 22:00:00 5 mg via ORAL
  Filled 2018-01-21: qty 1

## 2018-01-21 MED ORDER — SODIUM CHLORIDE 0.9 % IR SOLN
Status: DC | PRN
  Administered 2018-01-21: 1000 mL

## 2018-01-21 MED ORDER — ALPRAZOLAM 0.25 MG PO TABS: 0.2500 mg | ORAL_TABLET | ORAL | Status: DC

## 2018-01-21 MED ORDER — PHENYLEPHRINE 40 MCG/ML (10ML) SYRINGE FOR IV PUSH (FOR BLOOD PRESSURE SUPPORT)
PREFILLED_SYRINGE | INTRAVENOUS | Status: DC | PRN
  Administered 2018-01-21 (×3): 80 ug via INTRAVENOUS
  Administered 2018-01-21: 40 ug via INTRAVENOUS
  Administered 2018-01-21: 80 ug via INTRAVENOUS
  Administered 2018-01-21: 40 ug via INTRAVENOUS

## 2018-01-21 MED ORDER — ASPIRIN 81 MG PO CHEW
81.0000 mg | CHEWABLE_TABLET | Freq: Two times a day (BID) | ORAL | Status: DC
  Administered 2018-01-21 – 2018-01-22 (×2): 81 mg via ORAL
  Filled 2018-01-21 (×2): qty 1

## 2018-01-21 MED ORDER — OXYCODONE HCL 5 MG PO TABS
5.0000 mg | ORAL_TABLET | ORAL | Status: DC | PRN
  Administered 2018-01-21 – 2018-01-22 (×5): 5 mg via ORAL
  Filled 2018-01-21: qty 2
  Filled 2018-01-21 (×5): qty 1

## 2018-01-21 MED ORDER — HYDROMORPHONE HCL 1 MG/ML IJ SOLN
0.5000 mg | INTRAMUSCULAR | Status: DC | PRN
  Administered 2018-01-21: 0.5 mg via INTRAVENOUS
  Filled 2018-01-21: qty 1

## 2018-01-21 MED ORDER — CLINDAMYCIN PHOSPHATE 900 MG/50ML IV SOLN
900.0000 mg | INTRAVENOUS | Status: AC
  Administered 2018-01-21: 900 mg via INTRAVENOUS
  Filled 2018-01-21: qty 50

## 2018-01-21 MED ORDER — BUPROPION HCL ER (XL) 300 MG PO TB24
300.0000 mg | ORAL_TABLET | Freq: Every day | ORAL | Status: DC
  Administered 2018-01-22: 300 mg via ORAL
  Filled 2018-01-21: qty 1

## 2018-01-21 MED ORDER — DEXAMETHASONE SODIUM PHOSPHATE 10 MG/ML IJ SOLN
INTRAMUSCULAR | Status: DC | PRN
  Administered 2018-01-21: 10 mg via INTRAVENOUS

## 2018-01-21 MED ORDER — ADULT MULTIVITAMIN W/MINERALS CH
1.0000 | ORAL_TABLET | Freq: Every day | ORAL | Status: DC
  Administered 2018-01-21 – 2018-01-22 (×2): 1 via ORAL
  Filled 2018-01-21 (×2): qty 1

## 2018-01-21 MED ORDER — ONDANSETRON HCL 4 MG/2ML IJ SOLN
INTRAMUSCULAR | Status: DC | PRN
  Administered 2018-01-21: 4 mg via INTRAVENOUS

## 2018-01-21 MED ORDER — PROPOFOL 10 MG/ML IV BOLUS
INTRAVENOUS | Status: AC
  Filled 2018-01-21: qty 20

## 2018-01-21 MED ORDER — CHLORHEXIDINE GLUCONATE 4 % EX LIQD: 60.0000 mL | Freq: Once | CUTANEOUS | Status: DC

## 2018-01-21 MED ORDER — MIDAZOLAM HCL 2 MG/2ML IJ SOLN
INTRAMUSCULAR | Status: AC
  Filled 2018-01-21: qty 2

## 2018-01-21 MED ORDER — PHENYLEPHRINE HCL 10 MG/ML IJ SOLN
INTRAMUSCULAR | Status: AC
  Filled 2018-01-21: qty 1

## 2018-01-21 MED ORDER — ONDANSETRON HCL 4 MG PO TABS
4.0000 mg | ORAL_TABLET | Freq: Four times a day (QID) | ORAL | Status: DC | PRN
  Administered 2018-01-22: 13:00:00 4 mg via ORAL
  Filled 2018-01-21: qty 1

## 2018-01-21 MED ORDER — CLINDAMYCIN PHOSPHATE 600 MG/50ML IV SOLN
600.0000 mg | Freq: Four times a day (QID) | INTRAVENOUS | Status: AC
  Administered 2018-01-21 – 2018-01-22 (×2): 600 mg via INTRAVENOUS
  Filled 2018-01-21 (×2): qty 50

## 2018-01-21 MED ORDER — OXYCODONE HCL 5 MG PO TABS
10.0000 mg | ORAL_TABLET | ORAL | Status: DC | PRN
  Administered 2018-01-21: 21:00:00 10 mg via ORAL

## 2018-01-21 MED ORDER — ONDANSETRON HCL 4 MG/2ML IJ SOLN: 4.0000 mg | Freq: Four times a day (QID) | INTRAMUSCULAR | Status: DC | PRN

## 2018-01-21 MED ORDER — MENTHOL 3 MG MT LOZG: 1.0000 | LOZENGE | OROMUCOSAL | Status: DC | PRN

## 2018-01-21 MED ORDER — DEXTROSE 5 % IV SOLN
INTRAVENOUS | Status: DC | PRN
  Administered 2018-01-21: 25 ug/min via INTRAVENOUS

## 2018-01-21 MED ORDER — ALUM & MAG HYDROXIDE-SIMETH 200-200-20 MG/5ML PO SUSP: 30.0000 mL | ORAL | Status: DC | PRN

## 2018-01-21 MED ORDER — BUPIVACAINE IN DEXTROSE 0.75-8.25 % IT SOLN
INTRATHECAL | Status: DC | PRN
  Administered 2018-01-21: 14 mg via INTRATHECAL

## 2018-01-21 MED ORDER — ONDANSETRON HCL 4 MG/2ML IJ SOLN
INTRAMUSCULAR | Status: AC
  Filled 2018-01-21: qty 2

## 2018-01-21 MED ORDER — METOCLOPRAMIDE HCL 5 MG PO TABS: 5.0000 mg | ORAL_TABLET | Freq: Three times a day (TID) | ORAL | Status: DC | PRN

## 2018-01-21 MED ORDER — ALPRAZOLAM 0.25 MG PO TABS: 0.2500 mg | ORAL_TABLET | Freq: Every day | ORAL | Status: DC | PRN

## 2018-01-21 MED ORDER — PHENOL 1.4 % MT LIQD
1.0000 | OROMUCOSAL | Status: DC | PRN
  Filled 2018-01-21: qty 177

## 2018-01-21 MED ORDER — DOCUSATE SODIUM 100 MG PO CAPS
100.0000 mg | ORAL_CAPSULE | Freq: Two times a day (BID) | ORAL | Status: DC
  Administered 2018-01-21 – 2018-01-22 (×2): 100 mg via ORAL
  Filled 2018-01-21 (×3): qty 1

## 2018-01-21 MED ORDER — MEPERIDINE HCL 50 MG/ML IJ SOLN: 6.2500 mg | INTRAMUSCULAR | Status: DC | PRN

## 2018-01-21 MED ORDER — METHOCARBAMOL 500 MG PO TABS
500.0000 mg | ORAL_TABLET | Freq: Four times a day (QID) | ORAL | Status: DC | PRN
  Administered 2018-01-22: 12:00:00 500 mg via ORAL
  Filled 2018-01-21: qty 1

## 2018-01-21 MED ORDER — DEXAMETHASONE SODIUM PHOSPHATE 10 MG/ML IJ SOLN
INTRAMUSCULAR | Status: AC
  Filled 2018-01-21: qty 1

## 2018-01-21 MED ORDER — PROPOFOL 500 MG/50ML IV EMUL
INTRAVENOUS | Status: DC | PRN
  Administered 2018-01-21: 100 ug/kg/min via INTRAVENOUS

## 2018-01-21 MED ORDER — POLYETHYLENE GLYCOL 3350 17 G PO PACK: 17.0000 g | PACK | Freq: Every day | ORAL | Status: DC | PRN

## 2018-01-21 MED ORDER — PHENYLEPHRINE 40 MCG/ML (10ML) SYRINGE FOR IV PUSH (FOR BLOOD PRESSURE SUPPORT)
PREFILLED_SYRINGE | INTRAVENOUS | Status: AC
  Filled 2018-01-21: qty 10

## 2018-01-21 MED ORDER — PANTOPRAZOLE SODIUM 40 MG PO TBEC
40.0000 mg | DELAYED_RELEASE_TABLET | Freq: Every day | ORAL | Status: DC
  Administered 2018-01-21 – 2018-01-22 (×2): 40 mg via ORAL
  Filled 2018-01-21 (×2): qty 1

## 2018-01-21 MED ORDER — HYDROMORPHONE HCL 1 MG/ML IJ SOLN: 0.2500 mg | INTRAMUSCULAR | Status: DC | PRN

## 2018-01-21 MED ORDER — FENTANYL CITRATE (PF) 100 MCG/2ML IJ SOLN
INTRAMUSCULAR | Status: DC | PRN
  Administered 2018-01-21: 100 ug via INTRAVENOUS

## 2018-01-21 MED ORDER — DIPHENHYDRAMINE HCL 12.5 MG/5ML PO ELIX: 12.5000 mg | ORAL_SOLUTION | ORAL | Status: DC | PRN

## 2018-01-21 MED ORDER — FENTANYL CITRATE (PF) 100 MCG/2ML IJ SOLN
INTRAMUSCULAR | Status: AC
  Filled 2018-01-21: qty 2

## 2018-01-21 MED ORDER — ALPRAZOLAM 0.25 MG PO TABS
0.2500 mg | ORAL_TABLET | Freq: Three times a day (TID) | ORAL | Status: DC
  Administered 2018-01-21 – 2018-01-22 (×2): 0.25 mg via ORAL
  Filled 2018-01-21 (×2): qty 1

## 2018-01-21 MED ORDER — MIDAZOLAM HCL 5 MG/5ML IJ SOLN
INTRAMUSCULAR | Status: DC | PRN
  Administered 2018-01-21 (×2): 2 mg via INTRAVENOUS

## 2018-01-21 MED ORDER — LACTATED RINGERS IV SOLN
INTRAVENOUS | Status: DC
  Administered 2018-01-21 (×2): via INTRAVENOUS

## 2018-01-21 MED ORDER — METHOCARBAMOL 1000 MG/10ML IJ SOLN
500.0000 mg | Freq: Four times a day (QID) | INTRAVENOUS | Status: DC | PRN
  Administered 2018-01-21: 500 mg via INTRAVENOUS
  Filled 2018-01-21: qty 550

## 2018-01-21 MED ORDER — ACETAMINOPHEN 10 MG/ML IV SOLN: 1000.0000 mg | Freq: Once | INTRAVENOUS | Status: DC | PRN

## 2018-01-21 MED ORDER — HYDROCODONE-ACETAMINOPHEN 7.5-325 MG PO TABS: 1.0000 | ORAL_TABLET | Freq: Once | ORAL | Status: DC | PRN

## 2018-01-21 MED ORDER — PROPOFOL 10 MG/ML IV BOLUS
INTRAVENOUS | Status: AC
  Filled 2018-01-21: qty 40

## 2018-01-21 MED ORDER — ATORVASTATIN CALCIUM 10 MG PO TABS
10.0000 mg | ORAL_TABLET | Freq: Every day | ORAL | Status: DC
  Administered 2018-01-22: 10 mg via ORAL
  Filled 2018-01-21: qty 1

## 2018-01-21 MED ORDER — GABAPENTIN 100 MG PO CAPS
100.0000 mg | ORAL_CAPSULE | Freq: Three times a day (TID) | ORAL | Status: DC
  Administered 2018-01-21 – 2018-01-22 (×3): 100 mg via ORAL
  Filled 2018-01-21 (×3): qty 1

## 2018-01-21 MED ORDER — PROMETHAZINE HCL 25 MG/ML IJ SOLN: 6.2500 mg | INTRAMUSCULAR | Status: DC | PRN

## 2018-01-21 MED ORDER — ACETAMINOPHEN 325 MG PO TABS: 325.0000 mg | ORAL_TABLET | Freq: Four times a day (QID) | ORAL | Status: DC | PRN

## 2018-01-21 SURGICAL SUPPLY — 33 items
BAG ZIPLOCK 12X15 (MISCELLANEOUS) IMPLANT
BENZOIN TINCTURE PRP APPL 2/3 (GAUZE/BANDAGES/DRESSINGS) ×3 IMPLANT
BLADE SAW SGTL 18X1.27X75 (BLADE) ×2 IMPLANT
BLADE SAW SGTL 18X1.27X75MM (BLADE) ×1
CAPT HIP TOTAL 2 ×3 IMPLANT
CLOSURE WOUND 1/2 X4 (GAUZE/BANDAGES/DRESSINGS) ×1
COVER PERINEAL POST (MISCELLANEOUS) ×3 IMPLANT
COVER SURGICAL LIGHT HANDLE (MISCELLANEOUS) ×3 IMPLANT
DRAPE STERI IOBAN 125X83 (DRAPES) ×3 IMPLANT
DRAPE U-SHAPE 47X51 STRL (DRAPES) ×6 IMPLANT
DRSG AQUACEL AG ADV 3.5X10 (GAUZE/BANDAGES/DRESSINGS) ×3 IMPLANT
DURAPREP 26ML APPLICATOR (WOUND CARE) ×3 IMPLANT
ELECT REM PT RETURN 15FT ADLT (MISCELLANEOUS) ×3 IMPLANT
GAUZE XEROFORM 1X8 LF (GAUZE/BANDAGES/DRESSINGS) IMPLANT
GLOVE BIO SURGEON STRL SZ7.5 (GLOVE) ×3 IMPLANT
GLOVE BIOGEL PI IND STRL 8 (GLOVE) ×2 IMPLANT
GLOVE BIOGEL PI INDICATOR 8 (GLOVE) ×4
GLOVE ECLIPSE 8.0 STRL XLNG CF (GLOVE) ×3 IMPLANT
GOWN STRL REUS W/TWL XL LVL3 (GOWN DISPOSABLE) ×6 IMPLANT
HANDPIECE INTERPULSE COAX TIP (DISPOSABLE) ×2
HOLDER FOLEY CATH W/STRAP (MISCELLANEOUS) ×3 IMPLANT
PACK ANTERIOR HIP CUSTOM (KITS) ×3 IMPLANT
SET HNDPC FAN SPRY TIP SCT (DISPOSABLE) ×1 IMPLANT
STAPLER VISISTAT 35W (STAPLE) IMPLANT
STRIP CLOSURE SKIN 1/2X4 (GAUZE/BANDAGES/DRESSINGS) ×2 IMPLANT
SUT ETHIBOND NAB CT1 #1 30IN (SUTURE) ×3 IMPLANT
SUT MNCRL AB 4-0 PS2 18 (SUTURE) ×3 IMPLANT
SUT VIC AB 0 CT1 36 (SUTURE) ×3 IMPLANT
SUT VIC AB 1 CT1 36 (SUTURE) ×3 IMPLANT
SUT VIC AB 2-0 CT1 27 (SUTURE) ×4
SUT VIC AB 2-0 CT1 TAPERPNT 27 (SUTURE) ×2 IMPLANT
TRAY FOLEY W/METER SILVER 16FR (SET/KITS/TRAYS/PACK) ×3 IMPLANT
YANKAUER SUCT BULB TIP 10FT TU (MISCELLANEOUS) ×3 IMPLANT

## 2018-01-21 NOTE — H&P (Signed)
TOTAL HIP ADMISSION H&P  Patient is admitted for left total hip arthroplasty.  Subjective:  Chief Complaint: left hip pain  HPI: Desiree Gibson, 62 y.o. female, has a history of pain and functional disability in the left hip(s) due to arthritis and patient has failed non-surgical conservative treatments for greater than 12 weeks to include NSAID's and/or analgesics, corticosteriod injections and activity modification.  Onset of symptoms was gradual starting 2 years ago with gradually worsening course since that time.The patient noted no past surgery on the left hip(s).  Patient currently rates pain in the left hip at 10 out of 10 with activity. Patient has night pain, worsening of pain with activity and weight bearing, pain that interfers with activities of daily living and pain with passive range of motion. Patient has evidence of subchondral sclerosis, periarticular osteophytes and joint space narrowing by imaging studies. This condition presents safety issues increasing the risk of falls.  There is no current active infection.  Patient Active Problem List   Diagnosis Date Noted  . Unilateral primary osteoarthritis, left hip 11/22/2017   Past Medical History:  Diagnosis Date  . Anxiety   . Depression   . Pilonidal cyst     Past Surgical History:  Procedure Laterality Date  . WISDOM TOOTH EXTRACTION      Current Facility-Administered Medications  Medication Dose Route Frequency Provider Last Rate Last Dose  . chlorhexidine (HIBICLENS) 4 % liquid 4 application  60 mL Topical Once Richardean Canallark, Gilbert W, PA-C      . clindamycin (CLEOCIN) IVPB 900 mg  900 mg Intravenous On Call to OR Kirtland Bouchardlark, Gilbert W, PA-C      . lactated ringers infusion   Intravenous Continuous Trevor IhaHouser, Stephen A, MD 10 mL/hr at 01/21/18 1125    . sodium chloride irrigation 0.9 %    PRN Kathryne HitchBlackman, Seddrick Flax Y, MD   1,000 mL at 01/21/18 1203  . sodium chloride irrigation 0.9 %    PRN Kathryne HitchBlackman, Suhail Peloquin Y, MD   1,000 mL at  01/21/18 1204  . tranexamic acid (CYKLOKAPRON) 1,000 mg in sodium chloride 0.9 % 100 mL IVPB  1,000 mg Intravenous To OR Kathryne HitchBlackman, Izaiyah Kleinman Y, MD       Allergies  Allergen Reactions  . Penicillins Swelling and Rash    Lymph node swelling Has patient had a PCN reaction causing immediate rash, facial/tongue/throat swelling, SOB or lightheadedness with hypotension: No Has patient had a PCN reaction causing severe rash involving mucus membranes or skin necrosis: No Has patient had a PCN reaction that required hospitalization: No Has patient had a PCN reaction occurring within the last 10 years: No In groin area If all of the above answers are "NO", then may proceed with Cephalosporin use.     Social History   Tobacco Use  . Smoking status: Never Smoker  . Smokeless tobacco: Never Used  Substance Use Topics  . Alcohol use: Yes    Alcohol/week: 3.6 oz    Types: 5 Standard drinks or equivalent, 1 Glasses of wine per week    Family History  Problem Relation Age of Onset  . Scleroderma Mother   . Hypertension Father   . Scleroderma Father   . Hypertension Brother   . Hyperlipidemia Brother      Review of Systems  Musculoskeletal: Positive for joint pain.  All other systems reviewed and are negative.   Objective:  Physical Exam  Constitutional: She is oriented to person, place, and time. She appears well-developed and well-nourished.  HENT:  Head: Normocephalic and atraumatic.  Eyes: Pupils are equal, round, and reactive to light. EOM are normal.  Neck: Normal range of motion. Neck supple.  Cardiovascular: Normal rate and regular rhythm.  Respiratory: Effort normal and breath sounds normal.  GI: Soft. Bowel sounds are normal.  Musculoskeletal:       Left hip: She exhibits decreased range of motion, decreased strength, tenderness and bony tenderness.  Neurological: She is alert and oriented to person, place, and time.  Skin: Skin is warm and dry.  Psychiatric: She has a  normal mood and affect.    Vital signs in last 24 hours: Temp:  [98 F (36.7 C)] 98 F (36.7 C) (03/29 1104) Pulse Rate:  [65] 65 (03/29 1104) Resp:  [16] 16 (03/29 1104) BP: (143)/(77) 143/77 (03/29 1104) SpO2:  [99 %] 99 % (03/29 1104) Weight:  [137 lb (62.1 kg)] 137 lb (62.1 kg) (03/29 1000)  Labs:   Estimated body mass index is 22.8 kg/m as calculated from the following:   Height as of this encounter: 5\' 5"  (1.651 m).   Weight as of this encounter: 137 lb (62.1 kg).   Imaging Review Plain radiographs demonstrate severe degenerative joint disease of the left hip(s). The bone quality appears to be excellent for age and reported activity level.  Assessment/Plan:  End stage arthritis, left hip(s)  The patient history, physical examination, clinical judgement of the provider and imaging studies are consistent with end stage degenerative joint disease of the left hip(s) and total hip arthroplasty is deemed medically necessary. The treatment options including medical management, injection therapy, arthroscopy and arthroplasty were discussed at length. The risks and benefits of total hip arthroplasty were presented and reviewed. The risks due to aseptic loosening, infection, stiffness, dislocation/subluxation,  thromboembolic complications and other imponderables were discussed.  The patient acknowledged the explanation, agreed to proceed with the plan and consent was signed. Patient is being admitted for inpatient treatment for surgery, pain control, PT, OT, prophylactic antibiotics, VTE prophylaxis, progressive ambulation and ADL's and discharge planning.The patient is planning to be discharged home with home health services

## 2018-01-21 NOTE — Anesthesia Procedure Notes (Signed)
Spinal  Patient location during procedure: OR Start time: 01/21/2018 12:18 PM End time: 01/21/2018 12:20 PM Staffing Resident/CRNA: Lind Covert, CRNA Performed: resident/CRNA  Preanesthetic Checklist Completed: patient identified, site marked, surgical consent, pre-op evaluation, timeout performed, IV checked, risks and benefits discussed and monitors and equipment checked Spinal Block Patient position: sitting Prep: DuraPrep Patient monitoring: heart rate, cardiac monitor, continuous pulse ox and blood pressure Approach: midline Location: L3-4 Injection technique: single-shot Needle Needle type: Pencan  Needle gauge: 24 G Needle length: 10 cm Needle insertion depth: 6 cm Assessment Sensory level: T6 Additional Notes Timeout performed. SAB kit date checked. SAB without difficulty

## 2018-01-21 NOTE — Transfer of Care (Signed)
Immediate Anesthesia Transfer of Care Note  Patient: Aidyn Barraco  Procedure(s) Performed: LEFT TOTAL HIP ARTHROPLASTY ANTERIOR APPROACH (Left Hip)  Patient Location: PACU  Anesthesia Type:Spinal  Level of Consciousness: sedated  Airway & Oxygen Therapy: Patient Spontanous Breathing and Patient connected to face mask oxygen  Post-op Assessment: Report given to RN and Post -op Vital signs reviewed and stable  Post vital signs: Reviewed and stable  Last Vitals:  Vitals Value Taken Time  BP    Temp    Pulse 56 01/21/2018  1:59 PM  Resp    SpO2 100 % 01/21/2018  1:59 PM  Vitals shown include unvalidated device data.  Last Pain:  Vitals:   01/21/18 1104  TempSrc: Oral         Complications: No apparent anesthesia complications

## 2018-01-21 NOTE — Brief Op Note (Signed)
01/21/2018  2:02 PM  PATIENT:  Desiree Gibson  62 y.o. female  PRE-OPERATIVE DIAGNOSIS:  severe osteoarthritis left hip  POST-OPERATIVE DIAGNOSIS:  severe osteoarthritis left hip  PROCEDURE:  Procedure(s): LEFT TOTAL HIP ARTHROPLASTY ANTERIOR APPROACH (Left)  SURGEON:  Surgeon(s) and Role:    Kathryne Hitch* Daquarius Dubeau Y, MD - Primary  PHYSICIAN ASSISTANT: Rexene EdisonGil Clark, PA-C  ANESTHESIA:   spinal  EBL:  200 mL   COUNTS:  YES  DICTATION: .Other Dictation: Dictation Number [id number]  PLAN OF CARE: Admit to inpatient   PATIENT DISPOSITION:  PACU - hemodynamically stable.   Delay start of Pharmacological VTE agent (>24hrs) due to surgical blood loss or risk of bleeding: no

## 2018-01-22 ENCOUNTER — Other Ambulatory Visit: Payer: Self-pay

## 2018-01-22 LAB — BASIC METABOLIC PANEL
Anion gap: 6 (ref 5–15)
BUN: 10 mg/dL (ref 6–20)
CALCIUM: 8.3 mg/dL — AB (ref 8.9–10.3)
CO2: 28 mmol/L (ref 22–32)
Chloride: 105 mmol/L (ref 101–111)
Creatinine, Ser: 0.61 mg/dL (ref 0.44–1.00)
GFR calc Af Amer: >60 mL/min (ref >60)
GLUCOSE: 116 mg/dL — AB (ref 65–99)
POTASSIUM: 3.9 mmol/L (ref 3.5–5.1)
Sodium: 139 mmol/L (ref 135–145)

## 2018-01-22 LAB — CBC
HCT: 31.2 % — ABNORMAL LOW (ref 36.0–46.0)
Hemoglobin: 10.9 g/dL — ABNORMAL LOW (ref 12.0–15.0)
MCH: 31.8 pg (ref 26.0–34.0)
MCHC: 34.9 g/dL (ref 30.0–36.0)
MCV: 91 fL (ref 78.0–100.0)
PLATELETS: 177 10*3/uL (ref 150–400)
RBC: 3.43 MIL/uL — ABNORMAL LOW (ref 3.87–5.11)
RDW: 11.8 % (ref 11.5–15.5)
WBC: 9.9 10*3/uL (ref 4.0–10.5)

## 2018-01-22 MED ORDER — FLUCONAZOLE 150 MG PO TABS
150.0000 mg | ORAL_TABLET | Freq: Once | ORAL | Status: AC
  Administered 2018-01-22: 12:00:00 150 mg via ORAL
  Filled 2018-01-22: qty 1

## 2018-01-22 MED ORDER — ASPIRIN 81 MG PO CHEW: 81.0000 mg | CHEWABLE_TABLET | Freq: Two times a day (BID) | ORAL | 0 refills | Status: AC

## 2018-01-22 MED ORDER — METHOCARBAMOL 500 MG PO TABS: 500.0000 mg | ORAL_TABLET | Freq: Four times a day (QID) | ORAL | 0 refills | Status: AC | PRN

## 2018-01-22 MED ORDER — OXYCODONE HCL 5 MG PO TABS: 5.0000 mg | ORAL_TABLET | ORAL | 0 refills | Status: AC | PRN

## 2018-01-22 NOTE — Progress Notes (Signed)
Discharge instructions reviewed with patient. Patient verbalized understanding of all instructions. Prescriptions given to patient. Awaiting walker delivery. Will cont to monitor.

## 2018-01-22 NOTE — Discharge Instructions (Signed)

## 2018-01-22 NOTE — Discharge Summary (Signed)
Patient ID: Desiree Gibson MRN: [id number] DOB/AGE: Jun 08, 1956 62 y.o.  Admit date: 01/21/2018 Discharge date: 01/22/2018  Admission Diagnoses:  Principal Problem:   Unilateral primary osteoarthritis, left hip Active Problems:   Status post total replacement of left hip   Discharge Diagnoses:  Same  Past Medical History:  Diagnosis Date  . Anxiety   . Depression   . Pilonidal cyst     Surgeries: Procedure(s): LEFT TOTAL HIP ARTHROPLASTY ANTERIOR APPROACH on 01/21/2018   Consultants:   Discharged Condition: Improved  Hospital Course: Desiree Gibson is an 62 y.o. female who was admitted 01/21/2018 for operative treatment ofUnilateral primary osteoarthritis, left hip. Patient has severe unremitting pain that affects sleep, daily activities, and work/hobbies. After pre-op clearance the patient was taken to the operating room on 01/21/2018 and underwent  Procedure(s): LEFT TOTAL HIP ARTHROPLASTY ANTERIOR APPROACH.    Patient was given perioperative antibiotics:  Anti-infectives (From admission, onward)   Start     Dose/Rate Route Frequency Ordered Stop   01/22/18 1115  fluconazole (DIFLUCAN) tablet 150 mg     150 mg Oral  Once 01/22/18 1107     01/21/18 1800  clindamycin (CLEOCIN) IVPB 600 mg     600 mg 100 mL/hr over 30 Minutes Intravenous Every 6 hours 01/21/18 1539 01/22/18 0216   01/21/18 1057  clindamycin (CLEOCIN) IVPB 900 mg     900 mg 100 mL/hr over 30 Minutes Intravenous On call to O.R. 01/21/18 1057 01/21/18 1221       Patient was given sequential compression devices, early ambulation, and chemoprophylaxis to prevent DVT.  Patient benefited maximally from hospital stay and there were no complications.    Recent vital signs:  Patient Vitals for the past 24 hrs:  BP Temp Temp src Pulse Resp SpO2  01/22/18 1029 113/60 98.4 F (36.9 C) Oral 64 15 100 %  01/22/18 0457 (!) 92/53 (!) 97.5 F (36.4 C) Oral 66 13 99 %  01/22/18 0115 (!) 95/44 97.6 F (36.4 C) Oral 62 13  98 %  01/21/18 2208 (!) 117/59 - - - - -  01/21/18 2037 (!) 99/55 97.9 F (36.6 C) Oral 65 16 97 %  01/21/18 1854 (!) 117/59 97.7 F (36.5 C) Oral 76 20 98 %  01/21/18 1805 (!) 104/56 97.6 F (36.4 C) Oral 64 18 100 %  01/21/18 1655 119/68 97.7 F (36.5 C) Oral 62 18 100 %  01/21/18 1558 116/64 97.9 F (36.6 C) - 64 16 100 %  01/21/18 1530 115/64 - - 63 13 100 %  01/21/18 1515 105/61 - - 73 15 100 %  01/21/18 1500 104/69 - - (!) 59 15 100 %  01/21/18 1445 (!) 109/50 - - (!) 55 11 100 %  01/21/18 1430 (!) 97/59 - - (!) 55 15 100 %  01/21/18 1415 138/76 - - (!) 31 14 100 %  01/21/18 1400 (!) 90/41 (!) 97.4 F (36.3 C) - (!) 56 10 100 %     Recent laboratory studies:  Recent Labs    01/22/18 0518  WBC 9.9  HGB 10.9*  HCT 31.2*  PLT 177  NA 139  K 3.9  CL 105  CO2 28  BUN 10  CREATININE 0.61  GLUCOSE 116*  CALCIUM 8.3*     Discharge Medications:   Allergies as of 01/22/2018      Reactions   Penicillins Swelling, Rash   Lymph node swelling Has patient had a PCN reaction causing immediate rash, facial/tongue/throat swelling, SOB or  lightheadedness with hypotension: No Has patient had a PCN reaction causing severe rash involving mucus membranes or skin necrosis: No Has patient had a PCN reaction that required hospitalization: No Has patient had a PCN reaction occurring within the last 10 years: No In groin area If all of the above answers are "NO", then may proceed with Cephalosporin use.      Medication List    TAKE these medications   acetaminophen 500 MG tablet Commonly known as:  TYLENOL Take 1,000 mg by mouth every 6 (six) hours as needed (for hip pain.).   ALPRAZolam 0.25 MG tablet Commonly known as:  XANAX Take 0.25 mg by mouth See admin instructions. Take 1 tablet 3 times daily (scheduled) & may take an additional tablet if needed for anxiety.   aspirin 81 MG chewable tablet Chew 1 tablet (81 mg total) by mouth 2 (two) times daily.   atorvastatin 10  MG tablet Commonly known as:  LIPITOR Take 10 mg by mouth daily.   buPROPion 300 MG 24 hr tablet Commonly known as:  WELLBUTRIN XL Take 300 mg by mouth daily.   docusate sodium 100 MG capsule Commonly known as:  COLACE Take 100 mg by mouth at bedtime as needed for mild constipation (for constipation (alternates between docusate and senokot)).   meloxicam 15 MG tablet Commonly known as:  MOBIC Take 15 mg by mouth daily.   methocarbamol 500 MG tablet Commonly known as:  ROBAXIN Take 1 tablet (500 mg total) by mouth every 6 (six) hours as needed for muscle spasms.   multivitamin with minerals Tabs tablet Take 1 tablet by mouth daily. One-A-Day Multivitamin for 50+   oxyCODONE 5 MG immediate release tablet Commonly known as:  Oxy IR/ROXICODONE Take 1-2 tablets (5-10 mg total) by mouth every 4 (four) hours as needed for moderate pain (pain score 4-6).   SENOKOT S 8.6-50 MG tablet Generic drug:  senna-docusate Take 1 tablet by mouth at bedtime as needed for mild constipation (for constipation (alternates between docusate and senokot)).   zolpidem 12.5 MG CR tablet Commonly known as:  AMBIEN CR Take 12.5 mg by mouth at bedtime.            Durable Medical Equipment  (From admission, onward)        Start     Ordered   01/21/18 1600  DME 3 n 1  Once     01/21/18 1559   01/21/18 1600  DME Walker rolling  Once    Question:  Patient needs a walker to treat with the following condition  Answer:  Status post total replacement of left hip   01/21/18 1559      Diagnostic Studies: Dg Pelvis Portable  Result Date: 01/21/2018 CLINICAL DATA:  Left hip replacement. EXAM: PORTABLE PELVIS 1-2 VIEWS COMPARISON:  11/22/2017 FINDINGS: Desiree Gibson shows total hip replacement on the left. Components appear well positioned. No radiographically detectable complication. Right hip is negative. IMPRESSION: Good appearance following left total hip arthroplasty. Electronically Signed   By: Paulina Fusi M.D.   On: 01/21/2018 14:46   Dg C-arm 1-60 Min-no Report  Result Date: 01/21/2018 Fluoroscopy was utilized by the requesting physician.  No radiographic interpretation.   Dg Hip Operative Unilat With Pelvis Left  Result Date: 01/21/2018 CLINICAL DATA:  Left hip replacement. EXAM: OPERATIVE LEFT HIP (WITH PELVIS IF PERFORMED) 5 VIEWS TECHNIQUE: Fluoroscopic spot image(s) were submitted for interpretation post-operatively. COMPARISON:  11/22/2017. FINDINGS: Total left hip replacement with anatomic alignment. No acute  abnormality identified. Hardware intact. IMPRESSION: Total left hip replacement with anatomic alignment. Electronically Signed   By: Maisie Fus  Register   On: 01/21/2018 14:15    Disposition: Discharge disposition: 01-Home or Self Care       Discharge Instructions    Discharge patient   Complete by:  As directed    Discharge disposition:  01-Home or Self Care   Discharge patient date:  01/22/2018      Follow-up Information    Kathryne Hitch, MD Follow up in 2 week(s).   Specialty:  Orthopedic Surgery Contact information: [address] [place] [address] [phone]            Signed: Kathryne Hitch 01/22/2018, 11:08 AM

## 2018-01-22 NOTE — Progress Notes (Signed)
Subjective: 1 Day Post-Op Procedure(s) (LRB): LEFT TOTAL HIP ARTHROPLASTY ANTERIOR APPROACH (Left) Patient reports pain as moderate.    Objective: Vital signs in last 24 hours: Temp:  [97.4 F (36.3 C)-98.4 F (36.9 C)] 98.4 F (36.9 C) (03/30 1029) Pulse Rate:  [31-76] 64 (03/30 1029) Resp:  [10-20] 15 (03/30 1029) BP: (90-143)/(41-77) 113/60 (03/30 1029) SpO2:  [97 %-100 %] 100 % (03/30 1029)  Intake/Output from previous day: 03/29 0701 - 03/30 0700 In: 2770 [P.O.:420; I.V.:2250; IV Piggyback:100] Out: 2800 [Urine:2600; Blood:200] Intake/Output this shift: Total I/O In: 360 [P.O.:360] Out: 550 [Urine:550]  Recent Labs    01/22/18 0518  HGB 10.9*   Recent Labs    01/22/18 0518  WBC 9.9  RBC 3.43*  HCT 31.2*  PLT 177   Recent Labs    01/22/18 0518  NA 139  K 3.9  CL 105  CO2 28  BUN 10  CREATININE 0.61  GLUCOSE 116*  CALCIUM 8.3*   No results for input(s): LABPT, INR in the last 72 hours.  Sensation intact distally Intact pulses distally Dorsiflexion/Plantar flexion intact Incision: dressing C/D/I  Assessment/Plan: 1 Day Post-Op Procedure(s) (LRB): LEFT TOTAL HIP ARTHROPLASTY ANTERIOR APPROACH (Left) Up with therapy Plan for discharge tomorrow Discharge home with home health  Kathryne Hitch 01/22/2018, 10:55 AM

## 2018-01-22 NOTE — Evaluation (Addendum)
Occupational Therapy One Time Evaluation Patient Details Name: Desiree Gibson MRN: [id number] DOB: [birth year] Today's Date: 01/22/2018    History of Present Illness 62 y/o female s/p L direct THA (01/21/18)   Clinical Impression   Pt doing very well. Educated on safety with functional transfers to the 3in1 with walker, tub transfers, and LB dressing with AE. All education completed and will sign off for OT.    Follow Up Recommendations  No OT follow up    Equipment Recommendations  None recommended by OT    Recommendations for Other Services       Precautions / Restrictions Precautions Precautions: None Restrictions Weight Bearing Restrictions: Yes LLE Weight Bearing: Weight bearing as tolerated      Mobility Bed Mobility              General bed mobility comments: in chair.   Transfers Overall transfer level: Modified independent Equipment used: Rolling walker (2 wheeled) Transfers: Sit to/from Stand Sit to Stand: Supervision         General transfer comment: cues for hand placement.     Balance Overall balance assessment: Needs assistance         Standing balance support: During functional activity Standing balance-Leahy Scale: Fair Standing balance comment: requires some UE support due to pain                           ADL either performed or assessed with clinical judgement   ADL Overall ADL's : Needs assistance/impaired Eating/Feeding: Independent;Sitting   Grooming: Wash/dry hands;Set up;Sitting   Upper Body Bathing: Set up;Sitting   Lower Body Bathing: Minimal assistance;Sit to/from stand   Upper Body Dressing : Set up;Sitting   Lower Body Dressing: Moderate assistance;Sit to/from stand   Toilet Transfer: Min guard;Ambulation;RW;BSC   Toileting- Water quality scientist and Hygiene: Min guard;Sit to/from stand         General ADL Comments: Educated on tub transfer technique and advised to make sure she can weight bear  fully on L LE before attempting to step into a bathtub. Discussed having supervision for safety with showering initially and pt also has a tub seat if needed. Cautioned pt to make sure she keeps walker on the floor and side step through tight spaces if needed. Educated on AE use and pt has a Secondary school teacher but states she may consider purchasing AE kit. Educated on safety with 3in1 and how to use properly.     Vision Patient Visual Report: No change from baseline       Perception     Praxis      Pertinent Vitals/Pain Pain Assessment: Faces Pain Score: 3  Faces Pain Scale: Hurts a little bit Pain Location: L hip Pain Descriptors / Indicators: Grimacing Pain Intervention(s): Monitored during session;Limited activity within patient's tolerance     Hand Dominance     Extremity/Trunk Assessment Upper Extremity Assessment Upper Extremity Assessment: Overall WFL for tasks assessed           Communication Communication Communication: No difficulties   Cognition Arousal/Alertness: Awake/alert Behavior During Therapy: WFL for tasks assessed/performed Overall Cognitive Status: Within Functional Limits for tasks assessed                                     General Comments       Exercises    Shoulder Instructions  Home Living Family/patient expects to be discharged to:: Private residence Living Arrangements: Spouse/significant other Available Help at Discharge: Family;Available 24 hours/day Type of Home: House Home Access: Stairs to enter [employer] of Steps: 4 Entrance Stairs-Rails: Right Home Layout: One level     Bathroom Shower/Tub: Teacher, early years/pre: Standard     Home Equipment: Clinical cytogeneticist - standard;Bedside commode;Cane - single point;Adaptive equipment Adaptive Equipment: Reacher        Prior Functioning/Environment Level of Independence: Independent        Comments: retired        OT Problem List:         OT Treatment/Interventions:      OT Goals(Current goals can be found in the care plan section) Acute Rehab OT Goals Patient Stated Goal: home OT Goal Formulation: With patient  OT Frequency:     Barriers to D/C:            Co-evaluation              AM-PAC PT "6 Clicks" Daily Activity     Outcome Measure Help from another person eating meals?: None Help from another person taking care of personal grooming?: None Help from another person toileting, which includes using toliet, bedpan, or urinal?: A Little Help from another person bathing (including washing, rinsing, drying)?: A Little Help from another person to put on and taking off regular upper body clothing?: None Help from another person to put on and taking off regular lower body clothing?: A Little 6 Click Score: 21   End of Session Equipment Utilized During Treatment: Rolling walker  Activity Tolerance: Patient tolerated treatment well Patient left: in chair;with call bell/phone within reach  OT Visit Diagnosis: Muscle weakness (generalized) (M62.81)                Time: 3202-3343 OT Time Calculation (min): 23 min Charges:  OT General Charges $OT Visit: 1 Visit OT Evaluation $OT Eval Low Complexity: 1 Low OT Treatments $Therapeutic Activity: 8-22 mins G-Codes:       Desiree Gibson 01/22/2018, 1:34 PM

## 2018-01-22 NOTE — Progress Notes (Addendum)
Physical Therapy Treatment Patient Details Name: Desiree Gibson MRN: [id number] DOB: 1955-12-10 Today's Date: 01/22/2018    History of Present Illness 62 y/o female s/p L direct THA (01/21/18)    PT Comments    Pt up in chair upon arrival and states she will be going home today. She would like to practice stairs again.   Follow Up Recommendations  Home health PT     Equipment Recommendations  Rolling walker with 5" wheels    Recommendations for Other Services       Precautions / Restrictions Precautions Precautions: None Restrictions Weight Bearing Restrictions: Yes LLE Weight Bearing: Weight bearing as tolerated    Mobility  Bed Mobility Overal bed mobility: Modified Independent             General bed mobility comments: sitting up in chair upon arrival  Transfers Overall transfer level: Modified independent Equipment used: Rolling walker (2 wheeled) Transfers: Sit to/from Stand Sit to Stand: Modified independent (Device/Increase time)         General transfer comment: cues for hand placement  Ambulation/Gait Ambulation/Gait assistance: Supervision Ambulation Distance (Feet): 250 Feet Assistive device: Rolling walker (2 wheeled) Gait Pattern/deviations: Step-through pattern     General Gait Details: Pt working on increasing WB through L LE with gait   Stairs Stairs: Yes   Stair Management: One rail Right;With cane;Step to pattern Number of Stairs: 5 General stair comments: Pt with good recall of technique and safety  Wheelchair Mobility    Modified Rankin (Stroke Patients Only)       Balance Overall balance assessment: Needs assistance         Standing balance support: During functional activity Standing balance-Leahy Scale: Fair Standing balance comment: requires some UE support due to pain                            Cognition Arousal/Alertness: Awake/alert Behavior During Therapy: WFL for tasks  assessed/performed Overall Cognitive Status: Within Functional Limits for tasks assessed                                        Exercises Total Joint Exercises  Long Arc Quad: AROM;Left;10 reps;Seated    General Comments        Pertinent Vitals/Pain Pain Assessment: Faces Faces Pain Scale: Hurts a little bit Pain Location: L hip Pain Descriptors / Indicators: Grimacing Pain Intervention(s): Monitored during session;Limited activity within patient's tolerance    Home Living                      Prior Function            PT Goals (current goals can now be found in the care plan section) Acute Rehab PT Goals Patient Stated Goal: Home PT Goal Formulation: With patient/family Time For Goal Achievement: 01/26/18 Potential to Achieve Goals: Good Progress towards PT goals: Progressing toward goals    Frequency    7X/week      PT Plan Current plan remains appropriate    Co-evaluation              AM-PAC PT "6 Clicks" Daily Activity  Outcome Measure  Difficulty turning over in bed (including adjusting bedclothes, sheets and blankets)?: None Difficulty moving from lying on back to sitting on the side of the bed? : A Little Difficulty sitting down  on and standing up from a chair with arms (e.g., wheelchair, bedside commode, etc,.)?: A Little Help needed moving to and from a bed to chair (including a wheelchair)?: A Little Help needed walking in hospital room?: A Little Help needed climbing 3-5 steps with a railing? : A Little 6 Click Score: 19    End of Session Equipment Utilized During Treatment: Gait belt Activity Tolerance: Patient tolerated treatment well Patient left: in chair;with family/visitor present Nurse Communication: Mobility status;Patient requests pain meds PT Visit Diagnosis: Difficulty in walking, not elsewhere classified (R26.2);Pain Pain - Right/Left: Left Pain - part of body: Hip     Time: 1218-1228 PT Time  Calculation (min) (ACUTE ONLY): 10 min  Charges:  $Gait Training: 8-22 mins                    G Codes:       Turrell Severt L. Katrinka BlazingSmith, [place]PT Pager [phone] 01/22/2018    Enzo MontgomeryKaren L Lavona Norsworthy 01/22/2018, 1:09 PM

## 2018-01-22 NOTE — Evaluation (Addendum)
Physical Therapy Evaluation Patient Details Name: Desiree Gibson MRN: [id number] DOB: July 06, 1956 Today's Date: 01/22/2018   History of Present Illness  62 y/o female s/p L direct THA (01/21/18)  Clinical Impression  Pt admitted with above diagnosis. Pt currently with functional limitations due to the deficits listed below (see PT Problem List).  Pt will benefit from skilled PT to increase their independence and safety with mobility to allow discharge to the venue listed below.  Pt was able to ambulate in hallway with RW and initiated stair training.  Pt moving well and should continue to progress quickly. Recommend RW for home use.     Follow Up Recommendations Home health PT    Equipment Recommendations  Rolling walker with 5" wheels    Recommendations for Other Services       Precautions / Restrictions Precautions Precautions: None Restrictions Weight Bearing Restrictions: Yes LLE Weight Bearing: Weight bearing as tolerated      Mobility  Bed Mobility Overal bed mobility: Modified Independent             General bed mobility comments: HOB elevated  Transfers Overall transfer level: Needs assistance Equipment used: Rolling walker (2 wheeled) Transfers: Sit to/from Stand Sit to Stand: Supervision         General transfer comment: cues for hand placement  Ambulation/Gait Ambulation/Gait assistance: Min guard;Supervision Ambulation Distance (Feet): 250 Feet Assistive device: Rolling walker (2 wheeled) Gait Pattern/deviations: Step-to pattern;Step-through pattern     General Gait Details: Pt able to transition from a step to pattern to a step through pattern as gait progressed  Stairs Stairs: Yes Stairs assistance: Min guard Stair Management: One rail Right;With cane;Step to pattern Number of Stairs: 10 General stair comments: Pt did whole flight of stairs with close min/guard with rail and cane. Husband observed.  Wheelchair Mobility    Modified Rankin  (Stroke Patients Only)       Balance Overall balance assessment: Needs assistance         Standing balance support: During functional activity Standing balance-Leahy Scale: Fair Standing balance comment: requires some UE support due to pain                             Pertinent Vitals/Pain Pain Assessment: 0-10 Pain Score: 4  Pain Location: L hip Pain Descriptors / Indicators: Sore Pain Intervention(s): Limited activity within patient's tolerance;Monitored during session;Patient requesting pain meds-RN notified;Ice applied    Home Living Family/patient expects to be discharged to:: Private residence Living Arrangements: Spouse/significant other Available Help at Discharge: Family;Available 24 hours/day Type of Home: House Home Access: Stairs to enter Entrance Stairs-Rails: Right Entrance Stairs-Number of Steps: 4 Home Layout: One level Home Equipment: Shower seat;Walker - standard;Bedside commode;Cane - single point;Adaptive equipment      Prior Function Level of Independence: Independent         Comments: retired     Higher education careers adviser        Extremity/Trunk Assessment   Upper Extremity Assessment Upper Extremity Assessment: Defer to OT evaluation    Lower Extremity Assessment Lower Extremity Assessment: Overall WFL for tasks assessed;LLE deficits/detail LLE Deficits / Details: some stiffness with ROM, but WFL    Cervical / Trunk Assessment Cervical / Trunk Assessment: Normal  Communication   Communication: No difficulties  Cognition Arousal/Alertness: Awake/alert Behavior During Therapy: WFL for tasks assessed/performed Overall Cognitive Status: Within Functional Limits for tasks assessed  General Comments      Exercises Total Joint Exercises Ankle Circles/Pumps: AROM;Both;10 reps;Supine Quad Sets: Strengthening;Left;10 reps Heel Slides: AROM;Left;10 reps;Supine Hip  ABduction/ADduction: AROM;Left;10 reps;Supine   Assessment/Plan    PT Assessment Patient needs continued PT services  PT Problem List Decreased strength;Decreased range of motion;Decreased balance;Decreased mobility;Decreased knowledge of use of DME       PT Treatment Interventions DME instruction;Gait training;Stair training;Therapeutic exercise;Therapeutic activities;Patient/family education    PT Goals (Current goals can be found in the Care Plan section)  Acute Rehab PT Goals Patient Stated Goal: Home PT Goal Formulation: With patient/family Time For Goal Achievement: 01/26/18 Potential to Achieve Goals: Good    Frequency 7X/week   Barriers to discharge        Co-evaluation               AM-PAC PT "6 Clicks" Daily Activity  Outcome Measure Difficulty turning over in bed (including adjusting bedclothes, sheets and blankets)?: None Difficulty moving from lying on back to sitting on the side of the bed? : A Little Difficulty sitting down on and standing up from a chair with arms (e.g., wheelchair, bedside commode, etc,.)?: A Little Help needed moving to and from a bed to chair (including a wheelchair)?: A Little Help needed walking in hospital room?: A Little Help needed climbing 3-5 steps with a railing? : A Little 6 Click Score: 19    End of Session Equipment Utilized During Treatment: Gait belt Activity Tolerance: Patient tolerated treatment well Patient left: in chair;with family/visitor present;Other (comment)(chair alarm in chair, but not turned on, husband present and aware to let them know if he leaves) Nurse Communication: Mobility status;Patient requests pain meds PT Visit Diagnosis: Difficulty in walking, not elsewhere classified (R26.2);Pain Pain - Right/Left: Left Pain - part of body: Hip    Time: [id number] PT Time Calculation (min) (ACUTE ONLY): 32 min   Charges:   PT Evaluation $PT Eval Moderate Complexity: 1 Mod PT Treatments $Gait Training:  8-22 mins   PT G Codes:        Harsh Trulock L. Katrinka BlazingSmith, [place] Pager [phone] 01/22/2018   Enzo MontgomeryKaren L Jonee Lamore 01/22/2018, 10:28 AM

## 2018-01-22 NOTE — Progress Notes (Signed)
Patient ID: Desiree Gibson, female   DOB: [birth year], 62 y.o.   MRN: [id number] Doing very well.  Can be discharged to home this afternoon.

## 2018-01-22 NOTE — Progress Notes (Signed)
Patient discharged to home w/ husb. Given all belongings, instructions, prescriptions, equipment. Escorted to pov via w/c.

## 2018-01-23 NOTE — Care Management Note (Addendum)
Case Management Note  Patient Details  Name: Desiree Gibson MRN: [id number] Date of Birth: 1956-05-13  Subjective/Objective:  S/p Left THA                  Action/Plan: 01/22/2018 NCM spoke to pt and husband at bedside. Requested RW for home. Contacted [facility] for RW to be delivered to room prior to dc. Offered choice for HH/list provided. [facility]. Contacted rep to make aware of dc home today.   01/23/2018 Received email notification from [facility] that are unable to accept pt. [facility]. [facility]ed [facility] with new referral. Accepted referral and will plan for start of care on Monday. Attempted call to pt and left HIPPA compliant voicemail for a return call.   Received [facility] fax that orders were received and they are in process of setting up a HH agency. Contacted [facility] and they will escalate referral for start of care today.    Expected Discharge Date:  01/22/18               Expected Discharge Plan:  Home w Home Health Services  In-House Referral:  NA  Discharge planning Services  CM Consult  Post Acute Care Choice:  Home Health Choice offered to:  Patient  DME Arranged:  [place] rolling DME Agency:  [facility]  HH Arranged:  PT HH Agency:  [facility]  Status of Service:  Completed, signed off  If discussed at [employer] Length of Stay Meetings, dates discussed:    Additional Comments:  Elliot CousinShavis, Ziare Orrick Ellen, RN 01/23/2018, 12:28 PM

## 2018-01-24 ENCOUNTER — Encounter (HOSPITAL_COMMUNITY): Payer: Self-pay | Admitting: Orthopaedic Surgery

## 2018-01-24 NOTE — Anesthesia Postprocedure Evaluation (Signed)
Anesthesia Post Note  Patient: [name], [name]  Procedure(s) Performed: LEFT TOTAL HIP ARTHROPLASTY ANTERIOR APPROACH (Left Hip)     Patient location during evaluation: PACU Anesthesia Type: Spinal Level of consciousness: oriented and awake and alert Pain management: pain level controlled Vital Signs Assessment: post-procedure vital signs reviewed and stable Respiratory status: spontaneous breathing, respiratory function stable and patient connected to nasal cannula oxygen Cardiovascular status: blood pressure returned to baseline and stable Postop Assessment: no headache, no backache and no apparent nausea or vomiting Anesthetic complications: no    Last Vitals:  Vitals:   01/22/18 0457 01/22/18 1029  BP: (!) 92/53 113/60  Pulse: 66 64  Resp: 13 15  Temp: (!) 36.4 C 36.9 C  SpO2: 99% 100%    Last Pain:  Vitals:   01/22/18 1319  TempSrc:   PainSc: 4                  Trevor IhaStephen A Yonas Bunda

## 2018-01-24 NOTE — Op Note (Signed)
NAME:  Desiree Gibson, Desiree Gibson                 ACCOUNT NO.:  [id number]  MEDICAL RECORD NO.:  [id number]  LOCATION:                                 FACILITY:  PHYSICIAN:  Vanita Panda. Magnus Ivan, M.D.DATE OF BIRTH:  DATE OF PROCEDURE:  01/21/2018 DATE OF DISCHARGE:                              OPERATIVE REPORT   PREOPERATIVE DIAGNOSIS:  Primary osteoarthritis and degenerative joint disease, left hip.  POSTOPERATIVE DIAGNOSIS:  Primary osteoarthritis and degenerative joint disease, left hip.  PROCEDURE:  Left total hip arthroplasty.  IMPLANTS:  DePuy Sector Gription acetabular component size 52 and a single screw, size 36 +0 polyethylene liner, size 12 Corail femoral component with standard offset, size 36 -2 metal hip ball.  SURGEON:  Vanita Panda. Magnus Ivan, M.D.  ASSISTANT:  Richardean Canal, P.A.  ANESTHESIA:  Spinal.  ANTIBIOTICS:  900 mg of IV clindamycin.  BLOOD LOSS:  250 mL.  COMPLICATIONS:  None.  INDICATIONS:  Desiree Gibson is a very pleasant 62 year old female with debilitating arthritis involving her left hip.  This is well documented with clinical exam and x-rays showing complete loss of the joint space, as well as severe cystic changes in the femoral head and neck and acetabulum.  At this point, given the failure conservative treatment, she does wish to proceed with a total hip arthroplasty.  Her pain is daily and it has detrimentally affected her activities of daily living, her quality of life, and her mobility.  She understands the goals of surgery is to decrease pain, improve mobility, and overall improved quality of life.  She understands the risk of acute blood-loss anemia, nerve and vessel injury, fracture, infection, dislocation, and DVT.  PROCEDURE DESCRIPTION:  Informed consent was obtained, appropriate left hip was marked.  She was brought to the operating room.  Spinal anesthesia was obtained while she was on her stretcher.  A Foley catheter was placed and  both feet had the traction boots applied to them.  Of note, she was significantly short on her left hip preoperative.  She was placed supine on the Hana fracture table with the perineal post in place and both legs in inline skeletal traction devices, no traction applied.  Her left operative hip was prepped and draped with DuraPrep and sterile drapes.  Time-out was called and she was identified as correct patient and correct left hip.  We then made an incision inferior and posterior to the anterior superior iliac spine and carried this obliquely down the leg.  We dissected down the tensor fascia lata muscle, and the tensor fascia was then divided longitudinally to proceed with a direct anterior approach to the hip. We identified and cauterized the circumflex vessels, then identified the hip capsule.  We opened the hip capsule in an L-type format, finding a moderate joint effusion and significant osteoarthritis throughout her left hip.  We placed Cobra retractor around the lateral and medial femoral neck.  We cauterized the lateral femoral circumflex vessels as well.  We then made our femoral neck cut with an oscillating saw proximal to the lesser trochanter and completed this with an osteotome. I placed a corkscrew guide in the femoral head and removed the femoral head  its entirety and found to be devoid of cartilage.  We then cleaned the acetabulum remnants of acetabular labrum and other debris.  I placed a bent Hohmann over the medial acetabular rim and then began reaming under direct visualization from a size 42 reamer in stepwise increments going up to a size 51 with all reamers under direct visualization with the last reamer under direct fluoroscopy, so I could obtain my depth of reaming, our inclination and anteversion.  Once I was pleased with this, I placed the real DePuy Sector Gription acetabular component size 52 and a single screw.  I placed a 36 +0 polyethylene liner for that  size acetabular component.  Attention was then turned to the femur.  With the leg externally rotated to 120 degrees, extended, adducted, we were able to place a Mueller retractor medially and a Hohmann retractor behind the greater trochanter.  I released the lateral joint capsule and used a box cutting osteotome to enter femoral canal and a rongeur to lateralize. We then began broaching from a size 8 broach using the Corail broaching system, going up to a size 12.  With a size 12 in place, we trialed a standard offset femoral neck, and we had to go with a 36 -2 hip ball due to her tightness and leg lengths.  We reduced this in the acetabulum. We were pleased with stability.  We then dislocated the hip and removed the trial components.  We then placed the real Corail femoral component with standard offset, the real 36 -2 metal hip ball.  We reduced this in the acetabulum.  We were pleased with stability, offset, and leg length. We then irrigated the soft tissue with normal saline solution using pulsatile lavage.  We closed the joint capsule with an interrupted #1 Ethibond suture, followed by running #1 Vicryl in the tensor fascia, 0 Vicryl in the deep tissue, 2-0 Vicryl in the subcutaneous tissue, 4-0 Monocryl subcuticular stitch, and Steri-Strips on the skin.  An Aquacel dressing was applied.  She was taken off the Hana table and taken to recovery room in stable condition.  All final counts were correct. There were no complications noted.  Of note, Richardean CanalGilbert Clark, PA-C, assisted in this entire case.  His assistance was crucial for facilitating all aspects of this case.     Vanita Pandahristopher Y. Magnus IvanBlackman, M.D.   ______________________________ Vanita Pandahristopher Y. Magnus IvanBlackman, M.D.    CYB/MEDQ  D:  01/21/2018  T:  01/21/2018  Job:  [id number]

## 2018-01-25 ENCOUNTER — Telehealth (INDEPENDENT_AMBULATORY_CARE_PROVIDER_SITE_OTHER): Payer: Self-pay | Admitting: Orthopaedic Surgery

## 2018-01-25 NOTE — Telephone Encounter (Signed)
Verbal order given  

## 2018-01-25 NOTE — Telephone Encounter (Signed)
Selena BattenKim, PT with [facility] requesting verbal orders for home health physical therapy 3 x for 1 week And 2 x for 1 week.  CB # [id number]

## 2018-02-03 ENCOUNTER — Encounter (INDEPENDENT_AMBULATORY_CARE_PROVIDER_SITE_OTHER): Payer: Self-pay | Admitting: Orthopaedic Surgery

## 2018-02-03 ENCOUNTER — Ambulatory Visit (INDEPENDENT_AMBULATORY_CARE_PROVIDER_SITE_OTHER): Payer: Managed Care, Other (non HMO) | Admitting: Orthopaedic Surgery

## 2018-02-03 DIAGNOSIS — Z96642 Presence of left artificial hip joint: Secondary | ICD-10-CM

## 2018-02-03 NOTE — Progress Notes (Signed)
The patient is 2 weeks tomorrow status post a left total hip arthroplasty.  She is doing great she states.  She is having some numbness and tingling around the incision and some spasming but she is off all medications and is doing well overall.  She is not using assistive device.  She is only 62 years old.  On exam her incision looks great.  I placed new Steri-Strips.  Her leg lengths are equal.  There is no calf swelling and no foot swelling.  All questions concerns were answered and addressed.  She will continue increase her activities as comfort allows.  I would like to see her back in 4 weeks to see how she is doing overall but no x-rays are needed.

## 2018-03-03 ENCOUNTER — Encounter (INDEPENDENT_AMBULATORY_CARE_PROVIDER_SITE_OTHER): Payer: Self-pay | Admitting: Orthopaedic Surgery

## 2018-03-03 ENCOUNTER — Ambulatory Visit (INDEPENDENT_AMBULATORY_CARE_PROVIDER_SITE_OTHER): Payer: Managed Care, Other (non HMO) | Admitting: Orthopaedic Surgery

## 2018-03-03 DIAGNOSIS — Z96642 Presence of left artificial hip joint: Secondary | ICD-10-CM

## 2018-03-03 NOTE — Progress Notes (Signed)
The patient is now 6 weeks status post a left total hip arthroplasty.  She is walking without an assistive device and no limp.  She is had some pain with squatting down her yard and some knee pain as well as just some slight numbness to the lateral aspect of her incision but otherwise she is very satisfied and doing well.  On exam she does not walk with a limp.  Her leg lengths appear equal.  She tolerates me easily putting her hip the range of motion.  We talked about her slowly increasing her activities as comfort allows.  I do not want her doing running but she does not do that anyway.  All questions concerns were answered and addressed.  I will see her back in 6 months and at that visit I would like a low AP pelvis and lateral of her left operative hip.

## 2018-09-01 ENCOUNTER — Ambulatory Visit (INDEPENDENT_AMBULATORY_CARE_PROVIDER_SITE_OTHER): Payer: Managed Care, Other (non HMO)

## 2018-09-01 ENCOUNTER — Encounter (INDEPENDENT_AMBULATORY_CARE_PROVIDER_SITE_OTHER): Payer: Self-pay | Admitting: Orthopaedic Surgery

## 2018-09-01 ENCOUNTER — Ambulatory Visit (INDEPENDENT_AMBULATORY_CARE_PROVIDER_SITE_OTHER): Payer: Managed Care, Other (non HMO) | Admitting: Orthopaedic Surgery

## 2018-09-01 DIAGNOSIS — Z96642 Presence of left artificial hip joint: Secondary | ICD-10-CM

## 2018-09-01 NOTE — Progress Notes (Signed)
The patient is very pleasant and active 62 year old female who is status post a left total hip arthroplasty several months ago.  She says she is doing well overall and has no issues.  She is walking without a limp.  Her leg lengths are equal.  Her range of motion of her left hip is smooth and full.  Her incisions healed nicely.  At this point she is doing so well that she can follow-up as needed.  All question concerns were answered and addressed.  If she has any issues with the hip at all she knows to give [place] a call.

## 2018-11-12 IMAGING — DX DG PORTABLE PELVIS
1 series · 1 of 1 positions shown · non-contrast
Comparison: 11/22/2017

CLINICAL DATA: Left hip replacement.

EXAM:
PORTABLE PELVIS 1-2 VIEWS

[pelvis ap]
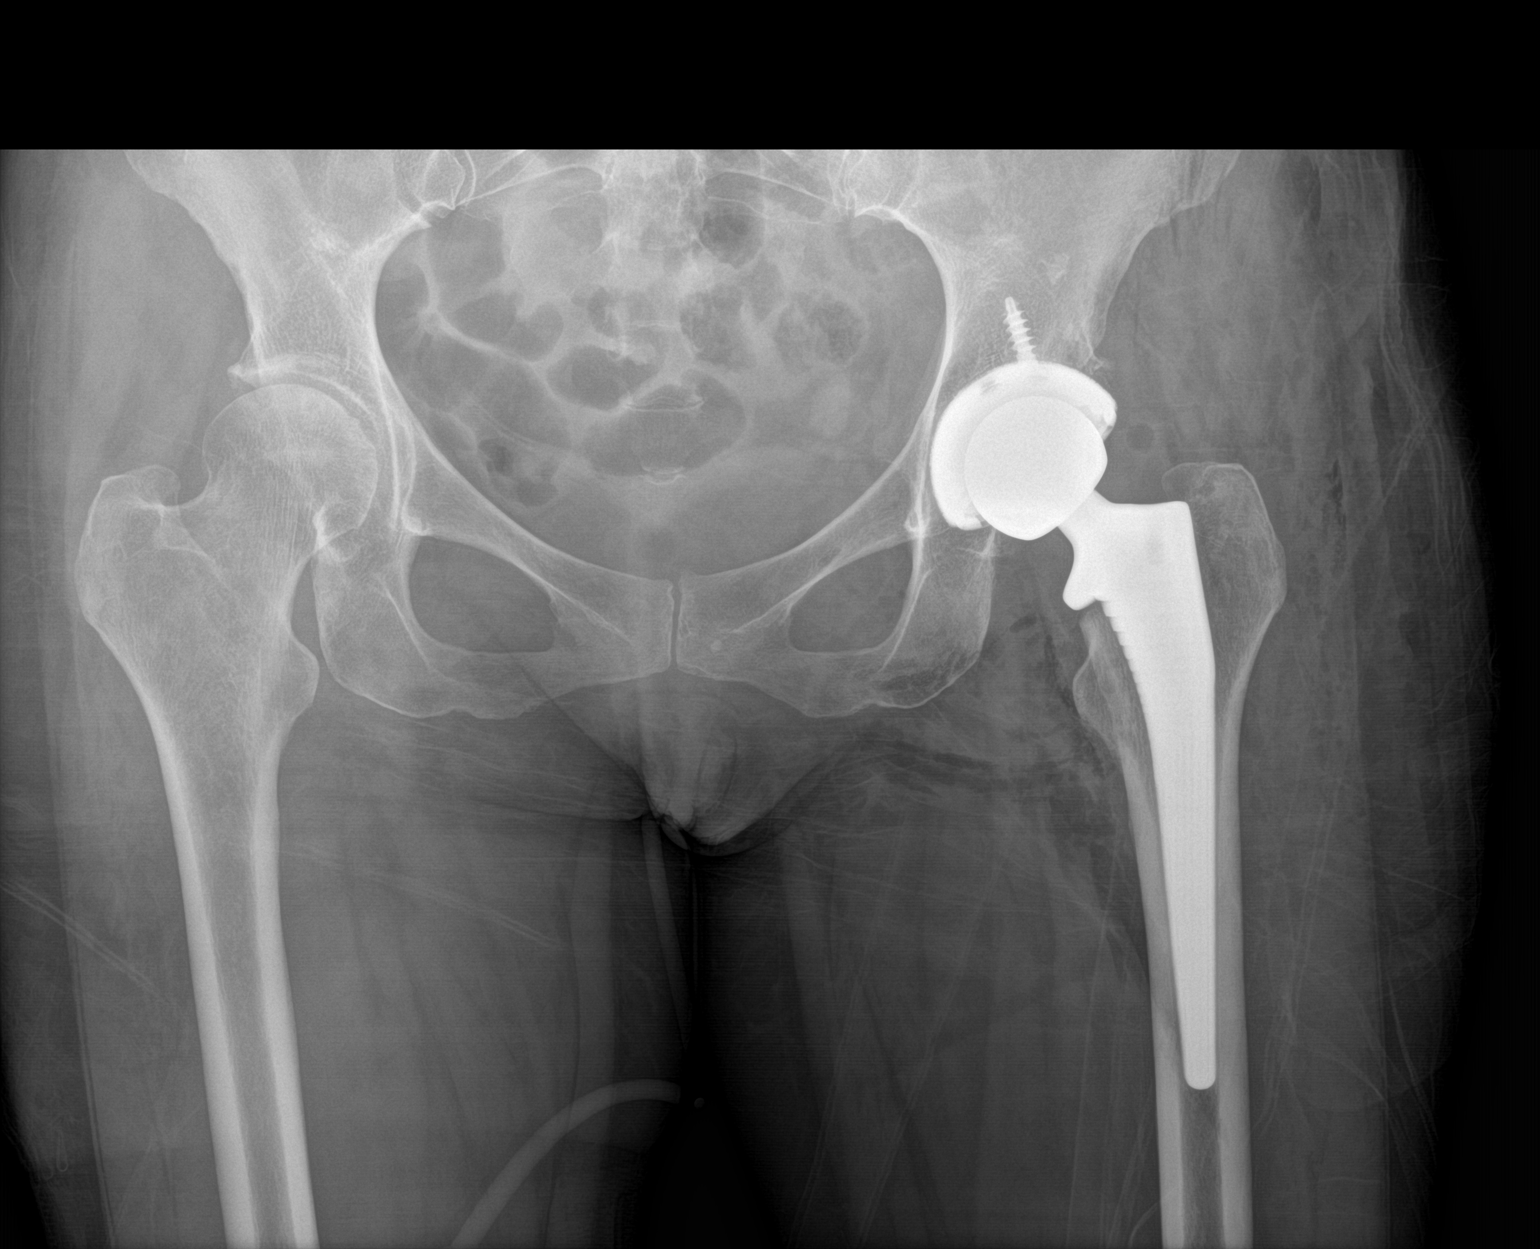

[1 of 1 positions shown; findings below may reference images not displayed]

FINDINGS: Single-view shows total hip replacement on the left. Components
appear well positioned. No radiographically detectable complication.
Right hip is negative.
IMPRESSION: Good appearance following left total hip arthroplasty.

## 2018-12-21 ENCOUNTER — Other Ambulatory Visit (HOSPITAL_COMMUNITY): Payer: Self-pay | Admitting: Psychiatry
